# Patient Record
Sex: Female | Born: 1975 | Race: Black or African American | Hispanic: No | Marital: Single | Smoking: Current every day smoker
Health system: Southern US, Community
[De-identification: ages and names within clinical notes are randomized; demographics above are authoritative.]

## PROBLEM LIST (undated history)

## (undated) ENCOUNTER — Inpatient Hospital Stay (HOSPITAL_COMMUNITY): Payer: Self-pay

## (undated) DIAGNOSIS — F419 Anxiety disorder, unspecified: Secondary | ICD-10-CM

## (undated) DIAGNOSIS — F319 Bipolar disorder, unspecified: Secondary | ICD-10-CM

## (undated) DIAGNOSIS — F329 Major depressive disorder, single episode, unspecified: Secondary | ICD-10-CM

## (undated) DIAGNOSIS — B009 Herpesviral infection, unspecified: Secondary | ICD-10-CM

## (undated) HISTORY — DX: Bipolar disorder, unspecified: F31.9

## (undated) HISTORY — DX: Herpesviral infection, unspecified: B00.9

## (undated) HISTORY — DX: Major depressive disorder, single episode, unspecified: F32.9

## (undated) HISTORY — DX: Anxiety disorder, unspecified: F41.9

## (undated) HISTORY — PX: TONSILLECTOMY: SUR1361

---

## 2003-10-06 ENCOUNTER — Emergency Department (HOSPITAL_COMMUNITY): Admission: AD | Admit: 2003-10-06 | Discharge: 2003-10-06 | Payer: Self-pay | Admitting: Family Medicine

## 2004-01-27 ENCOUNTER — Inpatient Hospital Stay (HOSPITAL_COMMUNITY): Admission: AD | Admit: 2004-01-27 | Discharge: 2004-01-27 | Payer: Self-pay | Admitting: Obstetrics

## 2004-08-13 ENCOUNTER — Inpatient Hospital Stay (HOSPITAL_COMMUNITY): Admission: AD | Admit: 2004-08-13 | Discharge: 2004-08-13 | Payer: Self-pay | Admitting: Obstetrics & Gynecology

## 2004-08-30 ENCOUNTER — Inpatient Hospital Stay (HOSPITAL_COMMUNITY): Admission: AD | Admit: 2004-08-30 | Discharge: 2004-08-30 | Payer: Self-pay | Admitting: Obstetrics & Gynecology

## 2004-08-31 ENCOUNTER — Inpatient Hospital Stay (HOSPITAL_COMMUNITY): Admission: AD | Admit: 2004-08-31 | Discharge: 2004-09-04 | Payer: Self-pay | Admitting: Obstetrics

## 2004-08-31 ENCOUNTER — Encounter (INDEPENDENT_AMBULATORY_CARE_PROVIDER_SITE_OTHER): Payer: Self-pay | Admitting: *Deleted

## 2004-09-05 ENCOUNTER — Encounter: Admission: RE | Admit: 2004-09-05 | Discharge: 2004-10-05 | Payer: Self-pay | Admitting: Obstetrics & Gynecology

## 2005-08-19 IMAGING — CR DG ABDOMEN ACUTE W/ 1V CHEST
4 series · 4 of 4 positions shown · non-contrast
Comparison: None.

CLINICAL DATA: Status post C-section 4 days ago. Lower abdominal pain. Rule out ileus.

ABDOMEN SERIES - 2 VIEW & CHEST - 1 VIEW

[view not recorded (1 of 4)]
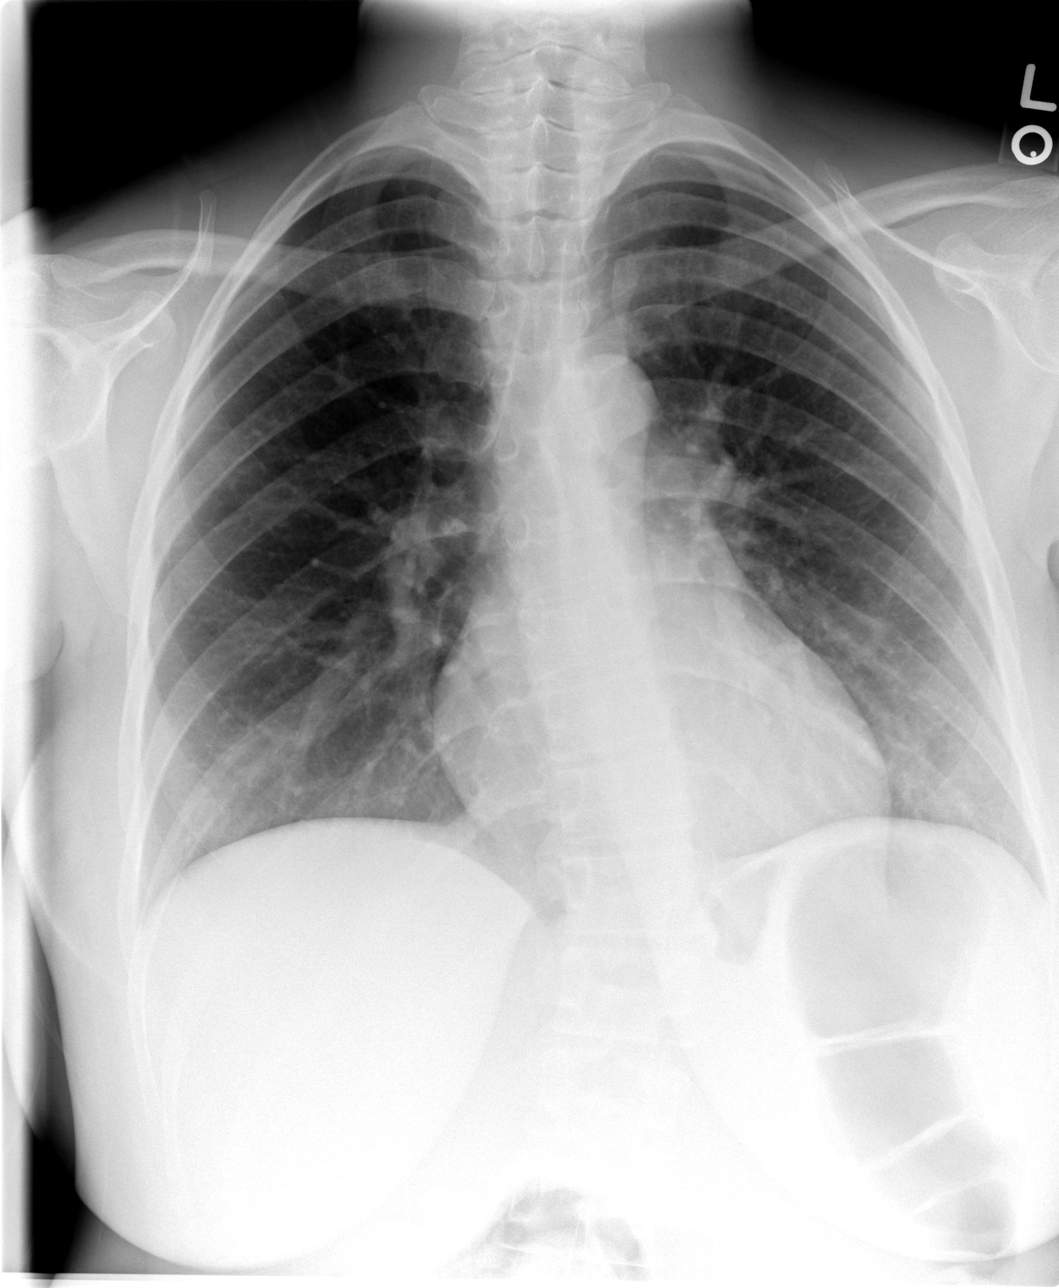

[view not recorded (2 of 4)]
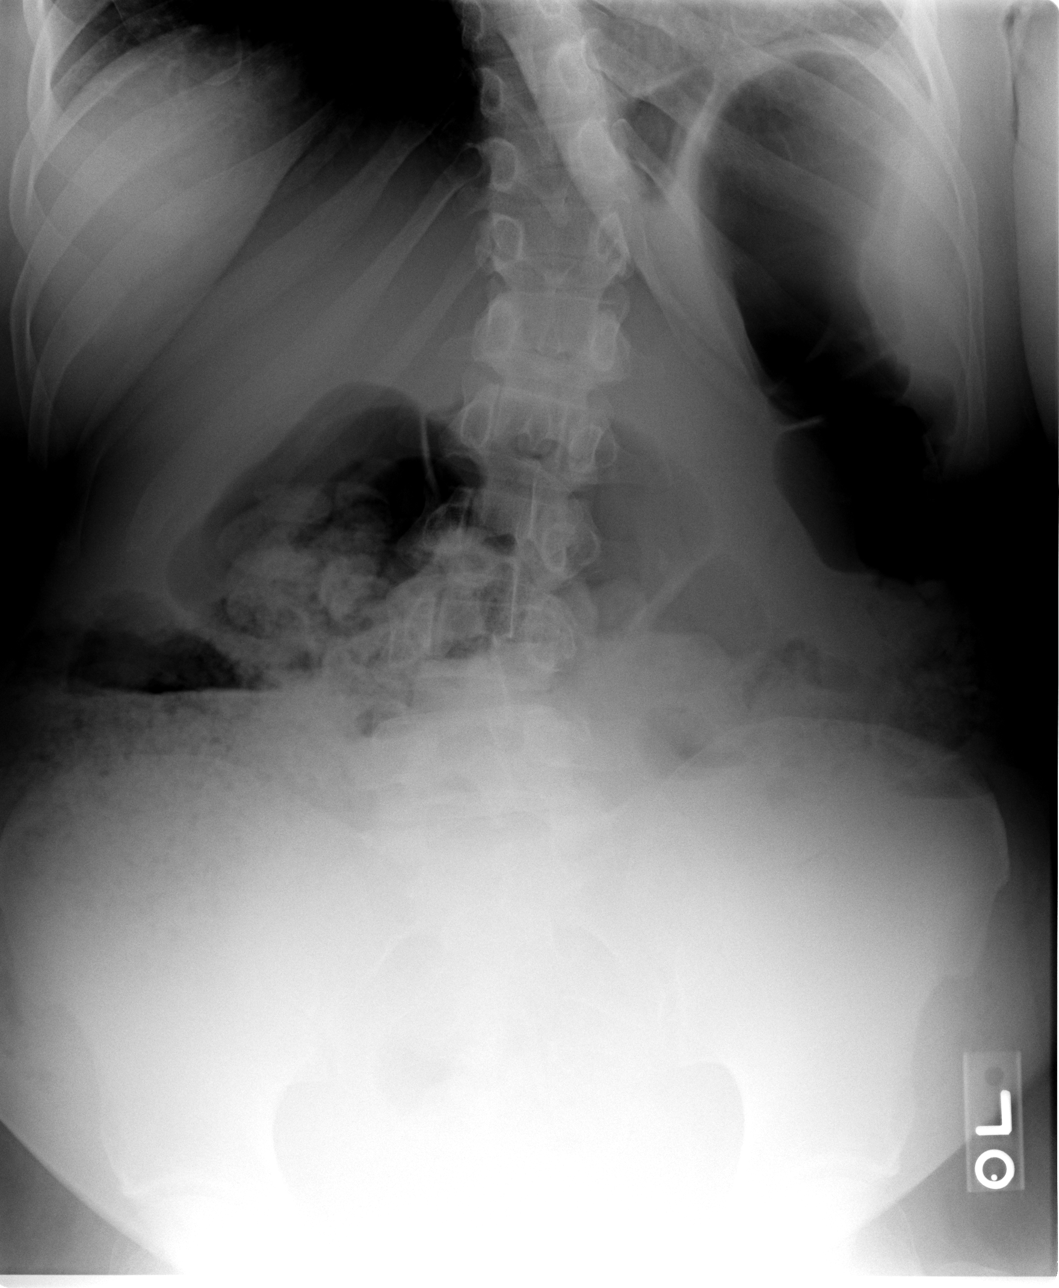

[view not recorded (3 of 4)]
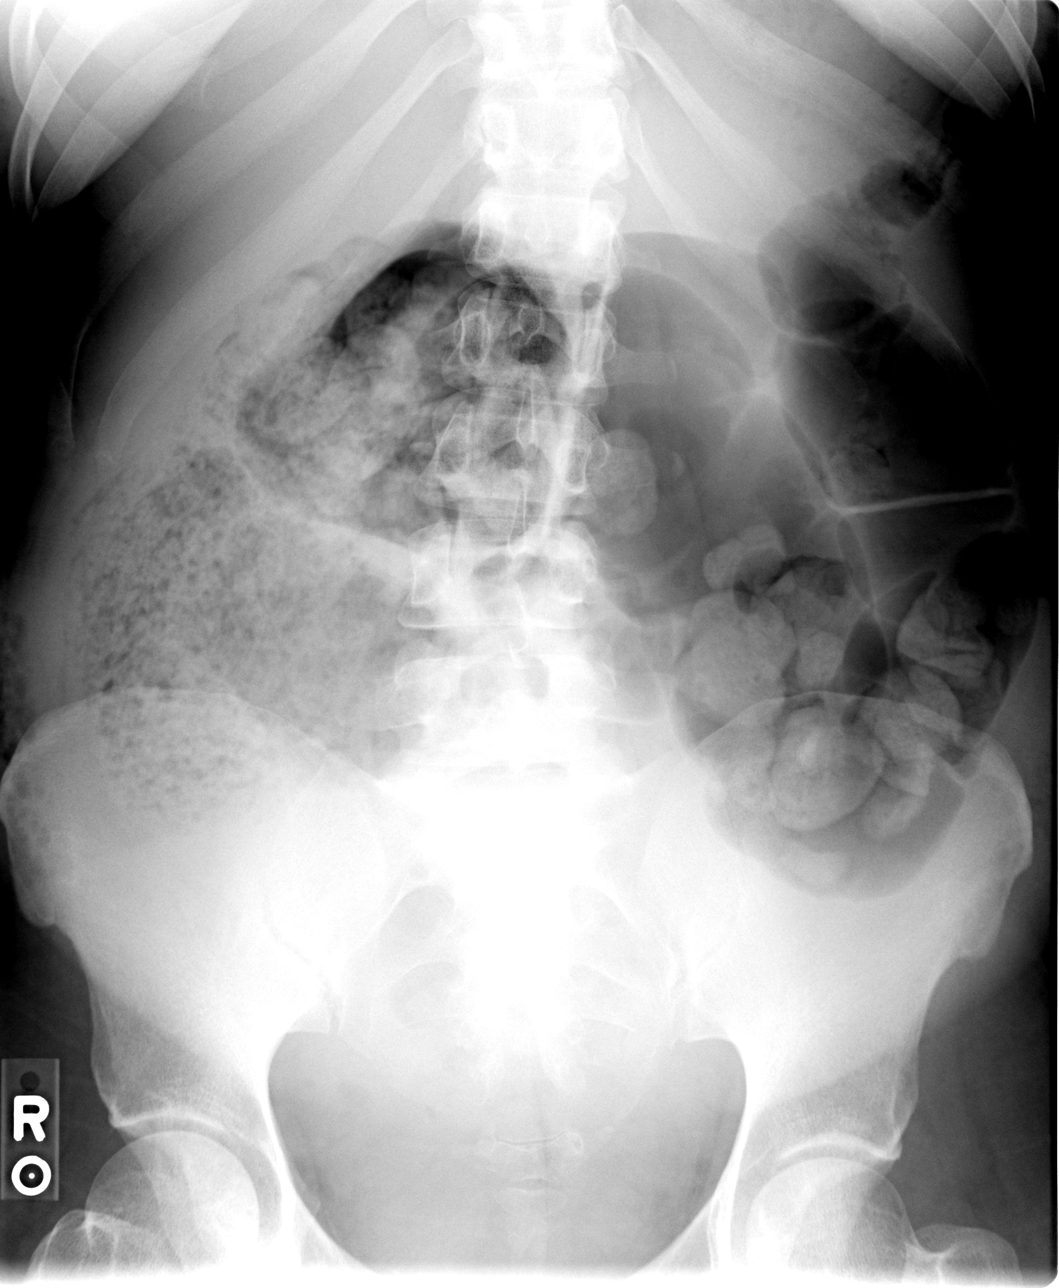

[view not recorded (4 of 4)]
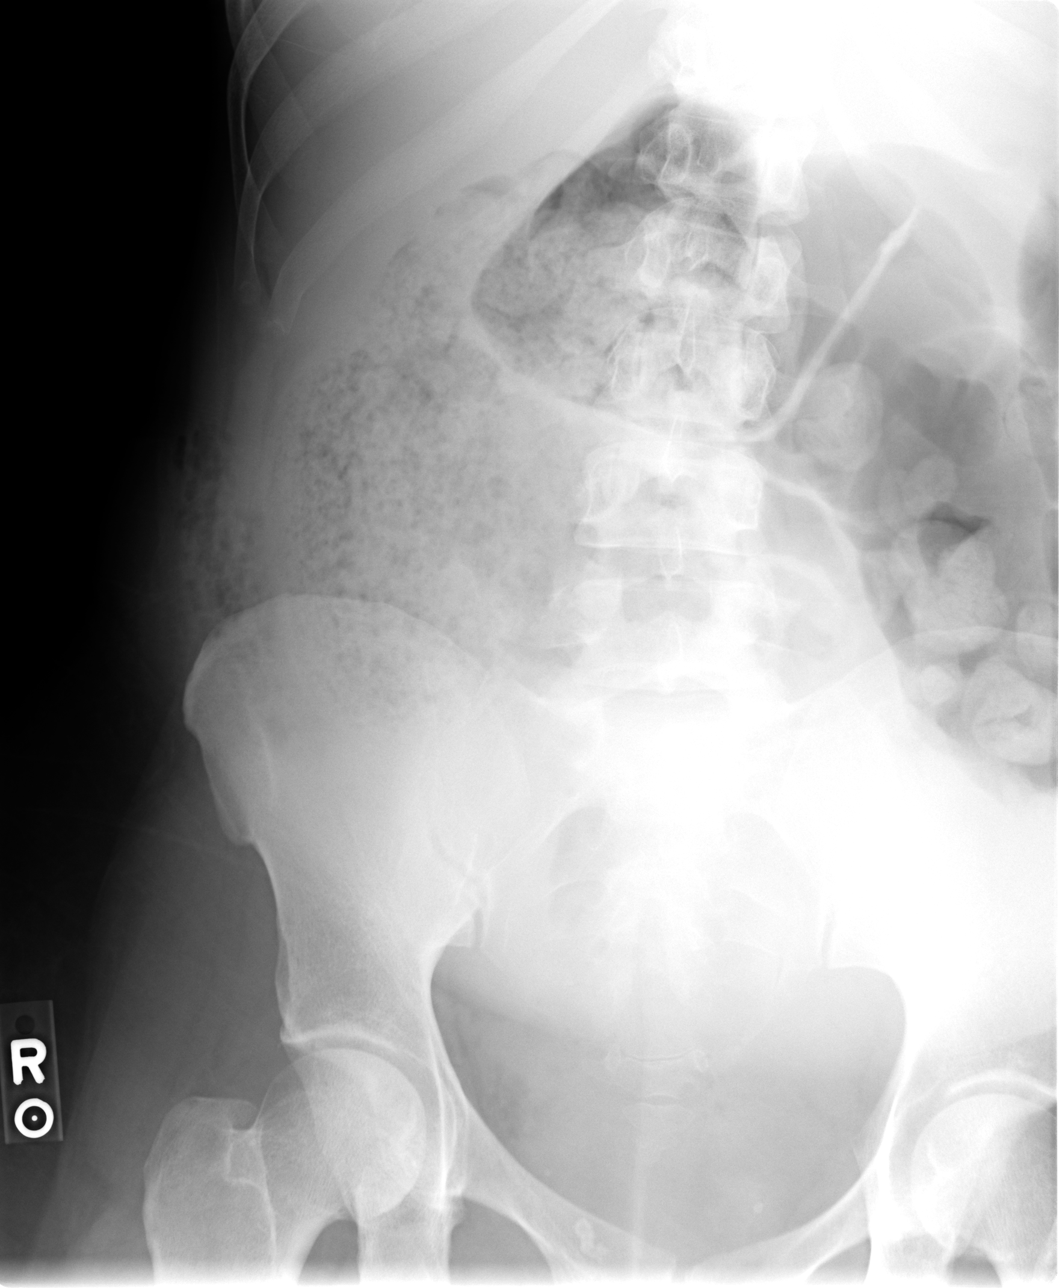

[4 of 4 positions shown; findings below may reference images not displayed]

FINDINGS: Upright chest shows clear lungs. The heart size is within normal limits. Image bony
structures are intact.

Supine and upright views of the abdomen are without evidence of intraperitoneal free air. Prominent
amount of stool is noted in the right colon with gaseous distention and formed stool noted in the
transverse colon. There is a paucity of small bowel gas in the pelvis consistent with recent
cesarean mottled gas is seen over the right lateral abdominal wall with an appearance suggesting
subcutaneous air.

IMPRESSION

No acute cardiopulmonary process.

Distended stool-filled ascending and transverse colon. Features are compatible with colonic
dysmotility.

Mottled gas seen over the right lateral abdominal wall. Subcutaneous emphysema is favored.
Pneumatosis is considered less likely. This may be postsurgical given the patient's history of
surgery 3 days ago although cellulitis could have this appearance. Please correlate clinically.

## 2009-05-12 ENCOUNTER — Emergency Department (HOSPITAL_COMMUNITY): Admission: EM | Admit: 2009-05-12 | Discharge: 2009-05-12 | Payer: Self-pay | Admitting: Emergency Medicine

## 2010-09-10 ENCOUNTER — Ambulatory Visit (HOSPITAL_COMMUNITY): Admission: RE | Admit: 2010-09-10 | Discharge: 2010-09-10 | Payer: Self-pay | Admitting: Obstetrics and Gynecology

## 2010-11-10 ENCOUNTER — Ambulatory Visit: Payer: Self-pay | Admitting: Obstetrics and Gynecology

## 2010-11-10 LAB — CONVERTED CEMR LAB
Antibody Screen: NEGATIVE
Basophils Relative: 0 % (ref 0–1)
Hgb F Quant: 0 % (ref 0.0–2.0)
Hgb S Quant: 0 % (ref 0.0–0.0)
MCHC: 31.6 g/dL (ref 30.0–36.0)
Monocytes Relative: 6 % (ref 3–12)
Neutro Abs: 8.1 10*3/uL — ABNORMAL HIGH (ref 1.7–7.7)
Neutrophils Relative %: 79 % — ABNORMAL HIGH (ref 43–77)
RBC: 3.82 M/uL — ABNORMAL LOW (ref 3.87–5.11)
Rubella: 104.9 intl units/mL — ABNORMAL HIGH
WBC: 10.2 10*3/uL (ref 4.0–10.5)

## 2010-11-11 ENCOUNTER — Encounter: Payer: Self-pay | Admitting: Obstetrics and Gynecology

## 2010-11-12 ENCOUNTER — Ambulatory Visit (HOSPITAL_COMMUNITY)
Admission: RE | Admit: 2010-11-12 | Discharge: 2010-11-12 | Payer: Self-pay | Source: Home / Self Care | Admitting: Family Medicine

## 2010-11-24 ENCOUNTER — Ambulatory Visit: Payer: Self-pay | Admitting: Obstetrics and Gynecology

## 2010-11-26 ENCOUNTER — Inpatient Hospital Stay (HOSPITAL_COMMUNITY)
Admission: AD | Admit: 2010-11-26 | Discharge: 2010-11-27 | Payer: Self-pay | Source: Home / Self Care | Attending: Obstetrics and Gynecology | Admitting: Obstetrics and Gynecology

## 2010-12-01 ENCOUNTER — Encounter: Payer: Self-pay | Admitting: Obstetrics & Gynecology

## 2010-12-01 ENCOUNTER — Inpatient Hospital Stay (HOSPITAL_COMMUNITY)
Admission: RE | Admit: 2010-12-01 | Discharge: 2010-12-04 | Payer: Self-pay | Source: Home / Self Care | Attending: Obstetrics & Gynecology | Admitting: Obstetrics & Gynecology

## 2011-01-27 ENCOUNTER — Ambulatory Visit: Payer: Self-pay | Admitting: Obstetrics & Gynecology

## 2011-02-21 ENCOUNTER — Ambulatory Visit: Payer: BC Managed Care – PPO | Admitting: Obstetrics and Gynecology

## 2011-02-21 DIAGNOSIS — Z3049 Encounter for surveillance of other contraceptives: Secondary | ICD-10-CM

## 2011-02-21 LAB — CBC
HCT: 29.1 % — ABNORMAL LOW (ref 36.0–46.0)
Hemoglobin: 7.8 g/dL — ABNORMAL LOW (ref 12.0–15.0)
MCH: 24 pg — ABNORMAL LOW (ref 26.0–34.0)
Platelets: 192 10*3/uL (ref 150–400)
Platelets: 214 10*3/uL (ref 150–400)
RBC: 3.31 MIL/uL — ABNORMAL LOW (ref 3.87–5.11)
RBC: 3.66 MIL/uL — ABNORMAL LOW (ref 3.87–5.11)
RDW: 18.4 % — ABNORMAL HIGH (ref 11.5–15.5)
WBC: 11.3 10*3/uL — ABNORMAL HIGH (ref 4.0–10.5)

## 2011-02-21 LAB — PROTEIN / CREATININE RATIO, URINE
Creatinine, Urine: 304.6 mg/dL
Protein Creatinine Ratio: 0.17 — ABNORMAL HIGH (ref 0.00–0.15)
Total Protein, Urine: 52 mg/dL

## 2011-02-21 LAB — COMPREHENSIVE METABOLIC PANEL
ALT: 14 U/L (ref 0–35)
AST: 20 U/L (ref 0–37)
Albumin: 2.5 g/dL — ABNORMAL LOW (ref 3.5–5.2)
Calcium: 8.8 mg/dL (ref 8.4–10.5)
Chloride: 106 mEq/L (ref 96–112)
Creatinine, Ser: 0.54 mg/dL (ref 0.4–1.2)
GFR calc Af Amer: >60 mL/min (ref >60)
Sodium: 136 mEq/L (ref 135–145)
Total Bilirubin: 0.3 mg/dL (ref 0.3–1.2)

## 2011-02-21 LAB — URINALYSIS, ROUTINE W REFLEX MICROSCOPIC
Ketones, ur: 15 mg/dL — AB
Leukocytes, UA: NEGATIVE
Nitrite: NEGATIVE
Protein, ur: 30 mg/dL — AB
Specific Gravity, Urine: >1.03 — ABNORMAL HIGH (ref 1.005–1.030)
Urobilinogen, UA: 1 mg/dL (ref 0.0–1.0)
pH: 6.5 (ref 5.0–8.0)

## 2011-02-21 LAB — SURGICAL PCR SCREEN: MRSA, PCR: NEGATIVE

## 2011-02-21 LAB — POCT URINALYSIS DIPSTICK
Bilirubin Urine: NEGATIVE
Glucose, UA: NEGATIVE mg/dL
Hgb urine dipstick: NEGATIVE
Specific Gravity, Urine: 1.02 (ref 1.005–1.030)

## 2011-02-21 LAB — TYPE AND SCREEN: Antibody Screen: NEGATIVE

## 2011-02-21 LAB — URINE MICROSCOPIC-ADD ON

## 2011-02-21 LAB — RPR: RPR Ser Ql: NONREACTIVE

## 2011-02-21 LAB — WET PREP, GENITAL
Trich, Wet Prep: NONE SEEN
Yeast Wet Prep HPF POC: NONE SEEN

## 2011-02-22 LAB — POCT URINALYSIS DIPSTICK
Bilirubin Urine: NEGATIVE
Hgb urine dipstick: NEGATIVE
Ketones, ur: NEGATIVE mg/dL
Specific Gravity, Urine: 1.025 (ref 1.005–1.030)
pH: 6.5 (ref 5.0–8.0)

## 2011-03-04 NOTE — Progress Notes (Signed)
Latoya Dean, SORTINO NO.:  [id number]  MEDICAL RECORD NO.:  [id number]           PATIENT TYPE:  A  LOCATION:  [facility]                   FACILITY:  [facility]  PHYSICIAN:  Catalina Antigua, MD     DATE OF BIRTH:  [birth year]  DATE OF SERVICE:                                 CLINIC NOTE  This is a 35 year old G3, P2-0-1-2 who is status post repeat C-section on December 04, 2010, who presents today for postpartum check.  The patient is currently without any complaints.  Denies pelvic pain or abnormal discharge.  The patient received Depo-Provera prior to leaving the hospital and states that her lochia has been reduced to daily spotting in the past few couple of weeks.  The patient is otherwise without any complaints.  She states that she is receiving ample help with the baby.  Denies any signs or symptoms of postpartum depression. Is bottle feeding the baby and has not resumed any sexual activity.  PHYSICAL EXAMINATION:  VITAL SIGNS:  Her blood pressure is 103/47, pulse of 64, weight of 77.2 kg, height of 67 inches, and temperature 98.8. LUNGS:  Her lungs are clear to auscultation bilaterally. HEART:  Regular rate and rhythm. BREASTS:  Her breasts are equal in size, nontender, no engorgement, and no palpable masses or lymphadenopathy. ABDOMEN:  Her abdomen is soft, nontender, and nondistended.  Incision is completely healed. PELVIC:  Normal vaginal mucosa and normal-appearing cervix.  No abnormal bleeding or discharge.  ASSESSMENT/PLAN:  This is a 35 year old G3, P2-0-1-2 who is status post repeat C-section on December 01, 2010, who presents today for postoperative check.  The patient is free to resume all regular activities without any limitations.  The patient is to continue Depo- Provera as she is happy with this current birth control option and will return every 12 weeks.  The patient is due for annual exam in November 2012.  The patient is to return otherwise on  a p.r.n. basis.          ______________________________ Catalina Antigua, MD    PC/MEDQ  D:  02/21/2011  T:  02/22/2011  Job:  [id number]

## 2011-04-29 NOTE — Op Note (Signed)
Latoya Dean, Latoya Dean             ACCOUNT NO.:  [id number]   MEDICAL RECORD NO.:  [id number]          PATIENT TYPE:  INP   LOCATION:  9146                          FACILITY:  [facility]   PHYSICIAN:  Roseanna Rainbow, M.D.DATE OF BIRTH:  1976/03/24   DATE OF PROCEDURE:  08/31/2004  DATE OF DISCHARGE:                                 OPERATIVE REPORT   PREOPERATIVE DIAGNOSES:  Intrauterine pregnancy at term, protracted latent  phase of labor, suspicious fetal heart rate tracing with variable  decelerations.   POSTOPERATIVE DIAGNOSES:  Intrauterine pregnancy at term, protracted latent  phase of labor, suspicious fetal heart rate tracing with variable  decelerations.  Occiput posterior presentation.   PROCEDURE:  Primary low transverse cesarean section via Pfannenstiel.   SURGEON:  Roseanna Rainbow, M.D.   ANESTHESIA:  Epidural.   COMPLICATIONS:  Left inferolateral extension of the uterine incision.   ESTIMATED BLOOD LOSS:  800 mL.   FLUIDS AND URINE OUTPUT:  As per anesthesiology.   FINDINGS:  Female infant cephalic presentation, left occiput posterior.  Neonatology present at delivery.  Normal uterus, tubes and ovaries.   DESCRIPTION OF PROCEDURE:  The patient was taken to the operating room where  her epidural anesthetic was found to be adequate.  She was then prepped and  draped in the usual sterile fashion in the dorsal supine position with a  leftward tilt.  A Pfannenstiel skin incision was then made with the scalpel  and carried through to the underlying of fascia with the Bovie. The fascia  was then nicked in the midline and the incision extended laterally with Mayo  scissors. The superior aspect of the fascial incision was then grasped with  Kocher clamps, elevated and the underlying rectus muscles dissected off.  Attention was then turned to the inferior aspect of this incision which in a  similar fashion was grasped and tinted up with Kocher clamps and the rectus  muscles dissected off. The rectus muscles were separated in the midline. The  paritoperitoneum was identified, tinted up and entered sharply.  The  peritoneal incision was then extended superiorly and inferiorly with good  visualization of the bladder.  The bladder blade was then inserted and the  vesicouterine peritoneum identified, grasped with __________ and entered  sharply with the Metzenbaum scissors. This incision was then extended  laterally and the bladder flap created digitally. The bladder blade was then  reinserted and the lower uterine segment incised in a transverse fashion  with the scalpel. The uterine incision was then extended with bandage  scissors. The bladder blade was then removed and the infant's head delivered  atraumatically. The nose and mouth were suctioned with the bulb suction and  the cord was clamped and cut.  The infant was handed off to the awaiting  neonatologist.  An umbilical artery cord gas was sent.  The placenta was  then removed manually. The uterus cleared of all clots and debris. The 2-3  cm lateral extension described above was repaired with #0 Monocryl in a  running locked fashion. A second layer of the same suture was used to obtain  excellent hemostasis.  The remainder of the uterine incision was repaired in  a similar fashion. The gutters were cleared of all clots. The peritoneum was  closed with 2-0 Vicryl. The fascia was reapproximated with #0 Vicryl in a  running fashion. The skin was closed with staples.  The patient tolerated  the procedure well. Sponge, lap and needle counts were correct x2.  One gram  of cefazolin was given and cord clamped. The patient was taken to the PACU  in stable condition.      LAJ/MEDQ  D:  09/01/2004  T:  09/01/2004  Job:  [id number]

## 2011-04-29 NOTE — Discharge Summary (Signed)
Latoya Dean, Latoya Dean             ACCOUNT NO.:  [id number]   MEDICAL RECORD NO.:  [id number]          PATIENT TYPE:  INP   LOCATION:  9146                          [facility]ILITY:  [facility]   PHYSICIAN:  Charles A. Clearance Coots, M.D.DATE OF BIRTH:  January 03, 1976   DATE OF ADMISSION:  08/31/2004  DATE OF DISCHARGE:  09/04/2004                                 DISCHARGE SUMMARY   ADMITTING DIAGNOSES:  1.  Forty weeks gestation.  2.  Uterine contractions.  3.  Early labor.   DISCHARGE DIAGNOSES:  1.  Forty weeks gestation.  2.  Uterine contractions.  3.  Early labor.  4.  Status post primary low transverse cesarean section for arrest of active      phase of labor, persistent occiput posterior position.  Delivered a      viable female on August 31, 2004 at 1344 with Apgars of 8 at one minute      and 9 at five minutes; weight of 6 pounds 10 ounces; cord pH of 7.29.      Mother and infant discharged home in good condition.   REASON FOR ADMISSION:  A 35 year old para 0 with estimated date of  confinement of August 30, 2004 presents with complaint of uterine  contractions.  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   PAST MEDICAL HISTORY:  Surgery:  Tonsillectomy.  Illnesses:  Urinary tract  infections.   MEDICATIONS:  Prenatal vitamins, Valtrex.   ALLERGIES:  No known drug allergies.   SOCIAL HISTORY:  Single.  Negative tobacco, alcohol, or drug use.   FAMILY HISTORY:  Noncontributory.   PHYSICAL EXAMINATION:  GENERAL:  Well-nourished, well-developed female in no  acute distress.  VITAL SIGNS:  Stable and she was afebrile.  Fetal heart tones were  reassuring.  The toco on external fetal monitoring revealed uterine  contractions every 2-4 minutes.  LUNGS:  Clear to auscultation bilaterally.  HEART:  Regular rate and rhythm.  ABDOMEN:  Gravid, nontender.  PELVIC:  Cervix was 3-4 cm  dilated, 80% effaced, vertex was at a -1 station.   LABORATORY VALUES:  Hemoglobin 11; hematocrit 35; white blood cell count  14,300; platelets 282,000.  Comprehensive metabolic panel within normal  limits.   HOSPITAL COURSE:  The patient was admitted and needed augmentation with  Pitocin because of slow progress.  Cervical exam essentially remained  without significant change and she dilated to 4-5 cm dilatation, 90%  effacement, and the vertex was at a 0 station.  There was no further  progress of labor after that point and after greater than 3 hours of no  progress, a decision was made to proceed with cesarean section delivery for  arrest of active phase of labor.  Primary low transverse cesarean section  was performed without complications on August 31, 2004.  A viable female  was delivered at 1344.  There were no intraoperative complications.  The  postoperative course was uncomplicated.  The patient was discharged home on  postoperative day #4 in good condition.  DISCHARGE LABORATORY VALUES:  Hemoglobin of 8.6; hematocrit 25.6; white  blood cell count 11,300; platelets 303,000.   DISCHARGE DISPOSITION:  1.  Medications:  Tylox and ibuprofen prescribed for pain.  Continue      prenatal vitamins.  Micronor was prescribed for birth control.  2.  Routine written instructions were given for discharge after cesarean      section.  3.  The patient is to follow up in the office in 1 week.     Char   CAH/MEDQ  D:  09/29/2004  T:  09/29/2004  Job:  [id number]

## 2011-05-10 ENCOUNTER — Ambulatory Visit: Payer: BC Managed Care – PPO

## 2011-06-15 NOTE — Discharge Summary (Addendum)
  Latoya Dean, Latoya Dean NO.:  [id number]  MEDICAL RECORD NO.:  [id number]  LOCATION:  9124                          [facility]ACILITY[facility]  PHYSICIAN:  Allie Bossier, MD        DATE OF BIRTH:  [birth year]  DATE OF ADMISSION:  12/01/2010 DATE OF DISCHARGE:  12/04/2010                              DISCHARGE SUMMARY   ADMISSION DIAGNOSIS:  Intrauterine pregnancy at 45 and 2 weeks and history of previous cesarean section and desired for repeat elective cesarean section.  DISCHARGE DIAGNOSIS:  Status post repeat low transverse cesarean section via Pfannenstiel skin incision.  ATTENDING:  Myra C. Marice Potter, MD  FELLOWMaryelizabeth Kaufmann, MD  OPERATION:  Repeat low transverse cesarean section via Pfannenstiel skin incision on December 01, 2010, by Dr. Macon Large.  FINDINGS:  Viable female infant in direct OP position, Apgars were 9 and 9, weight 7 pounds 4 ounces, clear amniotic fluid, and placenta sent to Pathology, otherwise there was normal uterus, tubes, and ovaries.  PERTINENT LABS:  The patient was noted to be anemic on admission with a hemoglobin of 8.8 and 27.4.  Her postoperative hemoglobin was 7.8 and 24.2.  Otherwise asymptomatic and doing well and ambulating fine.  HISTORY OF PRESENT ILLNESS:  This is a 35 year old gravida 3, para 2-0-1- 2 with a history of previous C-section who was admitted at 83 and 2 weeks for an elective repeat C-section.  The patient was not in labor at the time of admission.  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  Tobacco abuse and GBS was positive.  The patient went for her elective repeat C- section on December 01, 2010.  The patient tolerated the procedure well. The patient's postoperative course was benign.  She was discharged home on postoperative day #3 in stable condition and staples were removed prior to discharge.  DISPOSITION:  Discharged to home.  DISCHARGE  CONDITION:  Stable.  DISCHARGE MEDICATIONS: 1. Ibuprofen 600 mg 1 tablet p.o. q.6 h. as needed. 2. Percocet 5/325 one tablet p.o. q.4 h. p.r.n. 3. Ferrous sulfate 325 one tablet p.o. b.i.d. 4. Colace 100 mg 1 tablet p.o. b.i.d. 5. The patient was given Depo-Provera prior to discharge for     contraception.  FOLLOWUP:  The patient is to follow up [facility] 6 weeks and will have the baby-love nurse for an incision at 2 weeks around December 16, 2010.  ER WARNINGS:  The patient return to the emergency department if any fever, chills, nausea, vomiting, any worsening abdominal pain, any problems with her incision such as redness, swelling, discharge, or opening.    ______________________________ Maryelizabeth Kaufmann, MD   ______________________________ Allie Bossier, MD    LC/MEDQ  D:  12/04/2010  T:  12/04/2010  Job:  [id number]  Electronically Signed by Nicholaus Bloom MD on 06/15/2011 08:03:48 AM Electronically Signed by Maryelizabeth Kaufmann MD on 06/21/2011 08:45:36 AM

## 2011-07-05 ENCOUNTER — Telehealth: Payer: Self-pay | Admitting: *Deleted

## 2011-07-05 NOTE — Telephone Encounter (Signed)
Pt left message stating that she delivered her baby on 12/01/10 and has PP depression which is having a serious impact on her work. She is requesting a note to be out of work for disability ASAP.  I called pt. back, she stated that she has been seeing a counselor through her employer's EAP program and that counselor told her that she was not able to authorize disability. I responded that our providers also cannot authorize disability @ this time since she has not been under their care for the problem and had her baby 7 mos. ago. I recommended that pt speak w/the counselor again if she feels strongly that she needs to be on disability as she will most likely require care from another provider in order to accomplish that. Pt voiced understanding. Pt was given the tel# [phone] for [place] [facility] crisis intervention.

## 2011-07-07 ENCOUNTER — Ambulatory Visit: Payer: BC Managed Care – PPO | Admitting: Family Medicine

## 2011-07-14 ENCOUNTER — Ambulatory Visit (HOSPITAL_COMMUNITY)
Admission: RE | Admit: 2011-07-14 | Discharge: 2011-07-14 | Disposition: A | Discharge status: Home / Self Care | Payer: BC Managed Care – PPO | Attending: Psychiatry | Admitting: Psychiatry

## 2011-07-14 DIAGNOSIS — F411 Generalized anxiety disorder: Secondary | ICD-10-CM | POA: Insufficient documentation

## 2011-07-20 ENCOUNTER — Other Ambulatory Visit (HOSPITAL_COMMUNITY): Payer: BC Managed Care – PPO | Admitting: Psychiatry

## 2011-07-21 ENCOUNTER — Other Ambulatory Visit (HOSPITAL_COMMUNITY): Payer: BC Managed Care – PPO | Attending: Psychiatry | Admitting: Psychiatry

## 2011-07-21 DIAGNOSIS — F411 Generalized anxiety disorder: Secondary | ICD-10-CM | POA: Insufficient documentation

## 2011-07-21 DIAGNOSIS — B009 Herpesviral infection, unspecified: Secondary | ICD-10-CM | POA: Insufficient documentation

## 2011-07-22 ENCOUNTER — Other Ambulatory Visit (HOSPITAL_BASED_OUTPATIENT_CLINIC_OR_DEPARTMENT_OTHER): Payer: BC Managed Care – PPO | Admitting: Psychiatry

## 2011-07-22 DIAGNOSIS — F3189 Other bipolar disorder: Secondary | ICD-10-CM

## 2011-07-25 ENCOUNTER — Other Ambulatory Visit (HOSPITAL_COMMUNITY): Payer: BC Managed Care – PPO | Admitting: Psychiatry

## 2011-07-26 ENCOUNTER — Other Ambulatory Visit (HOSPITAL_COMMUNITY): Payer: BC Managed Care – PPO | Admitting: Psychiatry

## 2011-07-27 ENCOUNTER — Other Ambulatory Visit (HOSPITAL_COMMUNITY): Payer: BC Managed Care – PPO | Admitting: Psychiatry

## 2011-07-28 ENCOUNTER — Encounter (HOSPITAL_COMMUNITY): Payer: BC Managed Care – PPO | Admitting: Psychiatry

## 2011-07-28 ENCOUNTER — Other Ambulatory Visit (HOSPITAL_COMMUNITY): Payer: BC Managed Care – PPO | Admitting: Psychiatry

## 2011-07-29 ENCOUNTER — Other Ambulatory Visit (HOSPITAL_COMMUNITY): Payer: BC Managed Care – PPO | Admitting: Psychiatry

## 2011-08-01 ENCOUNTER — Other Ambulatory Visit (HOSPITAL_COMMUNITY): Payer: BC Managed Care – PPO | Admitting: Psychiatry

## 2011-08-02 ENCOUNTER — Other Ambulatory Visit (HOSPITAL_COMMUNITY): Payer: BC Managed Care – PPO | Admitting: Psychiatry

## 2011-08-03 ENCOUNTER — Other Ambulatory Visit (HOSPITAL_COMMUNITY): Payer: BC Managed Care – PPO | Admitting: Psychiatry

## 2011-08-04 ENCOUNTER — Ambulatory Visit (INDEPENDENT_AMBULATORY_CARE_PROVIDER_SITE_OTHER): Payer: BC Managed Care – PPO | Admitting: Physician Assistant

## 2011-08-04 ENCOUNTER — Other Ambulatory Visit (HOSPITAL_COMMUNITY): Payer: BC Managed Care – PPO | Admitting: Psychiatry

## 2011-08-04 ENCOUNTER — Encounter: Payer: Self-pay | Admitting: Physician Assistant

## 2011-08-04 VITALS — BP 123/66 | HR 69 | Temp 98.0°F | Ht 67.0 in | Wt 143.0 lb

## 2011-08-04 DIAGNOSIS — B373 Candidiasis of vulva and vagina: Secondary | ICD-10-CM

## 2011-08-04 DIAGNOSIS — F329 Major depressive disorder, single episode, unspecified: Secondary | ICD-10-CM

## 2011-08-04 DIAGNOSIS — F32A Depression, unspecified: Secondary | ICD-10-CM | POA: Insufficient documentation

## 2011-08-04 HISTORY — DX: Depression, unspecified: F32.A

## 2011-08-04 NOTE — Patient Instructions (Signed)
Candida Infection In Adults A candida infection (also called yeast, fungus and Monilia infection) is an overgrowth of yeast that can occur present anywhere on the body. A yesast infection commonly occurs in warm, moist body areas. Usually, the infection remains localized but can spread to become a systemic infection. A yeast infection may be a sign of a more severe disease such as diabetes, leukemia, or AIDS. A yeast infection can occur in both men and women. In women, Candida vaginitis is a vaginal infection. It is one of the most common causes of vaginitis. Men usually do not have symptoms or know they have an infection until other problems develop. Men may find out they have a yeast infection because their sex partner has a yeast infection. Uncircumcised men are more likely to get a yeast infection than circumcised men. This is because the uncircumcised glans is not exposed to air and does not remain as dry as that of a circumcised glans. Older adults may develop yeast infections around dentures. CAUSES Women  Antibiotics.  Steroid medication taken for a long time.   Being overweight (obese).   Diabetes.   Poor immune condition.   Certain serious medical conditions.   Immune suppressive medications for organ transplant patients.  Chemotherapy.   Pregnancy.   Menstration.   Stress and fatigue.   Intravenous drug use.   Oral contraceptives.  Wearing tight-fitting clothes in the crotch area.   Catching it from a sex partner who has a yeast infection.   Spermicide.   Intravenous, urinary, or other catheters.   Men  Catching it from a sex partner who has a yeast infection.  Having oral or anal sex with a person who has the infection.   Spermicide.   Diabetes.  Antibiotics.   Poor immune system.   Medications that suppress the immune system.   Intravenous drug use.  Intravenous, urinary, or other catheters.   SYMPTOMS Women  Thick, white vaginal  discharge.  Vaginal itching.   Redness and swelling in and around the vagina.   Irritation of the lips of the vagina and perineum.   Blisters on the vaginal lips and perineum.  Painful sexual intercourse.   Low blood sugar (hypoglycemia).   Painful urination.   Bladder infections.  Intestinal problems such as constipation, indigestion, bad breath, bloating, increase in gas, diarrhea, or loose stools.   Men  Men may develop intestinal problems such as constipation, indigestion, bad breath, bloating, increase in gas, diarrhea, or loose stools.  Dry, cracked skin on the penis with itching or discomfort.  Jock itch.   Dry, flaky skin.   Athlete's foot.  Hypoglycemia.   DIAGNOSIS Women  A history and an exam are performed.   The discharge may be examined under a microscope.   A culture may be taken of the discharge.  Men  A history and an exam are performed.   Any discharge from the penis or areas of cracked skin will be looked at under the microscope and cultured.   Stool samples may be cultured.  TREATMENT Women  Vaginal antifungal suppositories and creams.  Medicated creams to decrease irritation and itching on the outside of the vagina.   Warm compresses to the perineal area to decrease swelling and discomfort.   Oral antifungal medications.   Medicated vaginal suppositories or cream for repeated or recurrent infections.   Wash and dry the irritation areas before applying the cream.  Eating yogurt with lactobacillus may help with prevention and treatment.  Sometimes painting the vagina with gentian violet solution may help if creams and suppositories do not work.   Men  Antifungal creams and oral antifungal medications.  Sometimes treatment must continue for 30 days after the symptoms go away to prevent recurrence.  HOME CARE INSTRUCTIONS Women  Use cotton underwear and avoid tight-fitting clothing.  Avoid colored, scented toilet paper and  deodorant tampons or pads.   Do not douche.   Keep your diabetes under control.   Finish all the prescribed medications.   Keep your skin clean and dry.   Consume milk or yogurt with lactobacillus active culture regularly. If you get frequent yeast infections and think that is what the infection is, there are over-the-counter medications that you can get. If the infection does not show healing in 3 days, talk to your caregiver.  Tell your sex partner you have a yeast infection. Your partner may need treatment also, especially if your infection does not clear up or recurs.   Men  Keep your skin clean and dry.  Keep your diabetes under control.   Finish all prescribed medications.   Tell your sex partner that you have a yeast infection so they can be treated if necessary.  SEEK MEDICAL CARE IF:  Your symptoms do not clear up or worsen in one week after treatment.   You have an oral temperature above 100.5.   You have trouble swallowing or eating for a prolonged time.   You develop blisters on and around your vagina.   You develop vaginal bleeding and it is not your menstrual period.   You develop abdominal pain.   You develop intestinal problems as mentioned above.   You get weak or lightheaded.   You have painful or increased urination.   You have pain during sexual intercourse.  MAKE SURE YOU:  Understand these instructions.   Will watch your condition.   Will get help right away if you are not doing well or get worse.  Document Released: 01/05/2005 Document Re-Released: 05/18/2010 [facility] Patient Information 2011 [place], [place].IMPORTANT: HOW TO USE THIS INFORMATION:  This is a summary and does NOT have all possible information about this product. This information does not assure that this product is safe, effective, or appropriate for you. This information is not individual medical advice and does not substitute for the advice of your health care professional.  Always ask your health care professional for complete information about this product and your specific health needs.    ETONOGESTREL - IMPLANT (et-oh-no-GES-trel)    COMMON BRAND NAME(S): Implanon    USES:  This medication is used to prevent pregnancy. It is a thin plastic rod about the size of a matchstick that is inserted under the skin by a health care professional. The rod slowly releases etonogestrel into the body over a 3-year period. The rod must be removed after 3 years and can be replaced if continued birth control is desired. The rod can be removed at any time by a trained health care professional if birth control is no longer desired or there are side effects. It does not contain any estrogen. Etonogestrel (a form of progestin) is a hormone that prevents pregnancy by preventing the release of an egg (ovulation) and by changing the womb and cervical mucus to make it more difficult for an egg to meet sperm (fertilization) or for the fertilized egg to attach to the wall of the womb (implantation). This medication may not work as well in women who are  very overweight or those taking certain drugs. (See also Drug Interactions section.) Discuss your birth control options with your doctor. Using this medication does not protect you or your partner against sexually transmitted diseases (e.g., HIV, gonorrhea).    HOW TO USE:  Read the Patient Information Leaflet provided by your pharmacist or health care provider before the rod is placed. Read and sign the Informed Consent provided by your doctor. You will also be given a User Card with the date and the place on your body where the rod was inserted. Keep the card and use it to remind yourself when to schedule an appointment to have the rod removed. If you have any questions, ask your doctor or pharmacist. Ask your doctor about the best time to schedule your appointment to have the rod placed. Your doctor may want you to have a pregnancy test first. The  medication usually starts working right away when the rod is inserted on days 1 through 5 after the start of your regular menstrual bleeding. If your appointment is at another time in your menstrual cycle, you may need to use a non-hormonal form of birth control (e.g., condoms, diaphragm, spermicide) for the first 7 days after the rod is placed. Ask your doctor about whether you need back-up birth control. The rod will be inserted into the skin in your upper arm. Usually it will be placed in the arm opposite the side you write with. Be sure you can feel the rod underneath your skin after it has been placed. There will be 2 bandages covering the area where the rod is placed. Leave the top bandage on for 24 hours. Keep the smaller bandage on for 3-5 days or as directed by your doctor. Keep the bandage clean and dry.    SIDE EFFECTS:  Nausea, stomach cramping/bloating, dizziness, headache, breast tenderness, acne, hair loss, weight gain, and vaginal irritation/discharge may occur. Pain, bruising, numbness, infection, and scarring may occur at the site where the rod is placed. If any of these effects persist or worsen, notify your doctor promptly. Your periods may be early or late, shorter or longer, heavier or lighter than normal. You may also have some spotting between periods, especially during the first several months of use. If bleeding is prolonged (more than 8 days) or unusually heavy, contact your doctor. If you miss 2 periods in a row, contact your doctor for a pregnancy test. Remember that your doctor has prescribed this medication because he or she has judged that the benefit to you is greater than the risk of side effects. Many people using this medication do not have serious side effects. The rod must be removed after 3 years. This is usually a simple procedure done in your doctor's office. Rarely (e.g., if the rod has been placed too deeply or can't be felt), the rod may require surgery to remove. Tell  your doctor immediately if any of these unlikely but serious side effects occur: depression, unwanted facial/body hair. This medication may rarely cause serious (sometimes fatal) problems from blood clots (e.g., pulmonary embolism, stroke, heart attack). Seek immediate medical attention if you experience: sudden shortness of breath, chest/jaw/left arm pain, confusion, coughing up blood, sudden dizziness/fainting, pain/swelling/warmth in the groin/calf, tingling/weakness/numbness in the arms/legs, headaches that are different from those you may have experienced in the past (e.g., headaches with other symptoms such as vision changes/lack of coordination, existing migraines becoming worse, sudden/very severe headaches), slurred speech, weakness on one side of the body, vision problems/changes.  Tell your doctor immediately if any of these rare but very serious side effects occur: severe stomach/abdominal/pelvic pain, lumps in the breast, unusual tiredness, dark urine, yellowing eyes/skin. A very serious allergic reaction to this drug is rare. However, seek immediate medical attention if you notice any symptoms of a serious allergic reaction, including: rash, itching/swelling (especially of the face/tongue/throat), severe dizziness, trouble breathing. This is not a complete list of possible side effects. If you notice other effects not listed above, contact your doctor or pharmacist. In the [place] - Call your doctor for medical advice about side effects. You may report side effects to FDA at 1-800-FDA-1088. In [place] - Call your doctor for medical advice about side effects. You may report side effects to [facility] [place] at [phone].    PRECAUTIONS:  Before having the rod placed, tell your doctor or pharmacist if you are allergic to etonogestrel; or to other progestins; or to any anesthetics or antiseptics that might be used in the procedure; or if you have any other allergies. This product may contain inactive  ingredients, which can cause allergic reactions or other problems. Talk to your pharmacist for more details. This medication should not be used if you have certain medical conditions. Before using this medicine, consult your doctor or pharmacist if you have: abnormal breast exam, breast cancer, heart attack/stroke/other blood clots (e.g., in the legs, eyes, lungs), liver problems (e.g., liver tumor, active liver disease), current or suspected pregnancy, unexplained vaginal bleeding. Before using this medication, tell your doctor or pharmacist your medical history, especially of: depression, high blood pressure, low levels of "good" cholesterol (HDL), diabetes, gall bladder disease, heart disease (e.g., chest pain), history of yellowing eyes/skin (jaundice) during pregnancy or while using birth control pills, migraine headaches, seizures, long periods of sitting or lying down (e.g., immobility such as being bedridden). Smoking cigarettes/using tobacco while using hormonal birth control (implant/pill/patch/ring) increases your risk of heart problems and stroke. Do not smoke. The risk of heart problems increases with age (especially in women over 44) and with frequent smoking (15 or more cigarettes a day). Notify your doctor at least 4 weeks beforehand if you will be having surgery or will be confined to a chair/bed for a long time (e.g., a long plane flight). You may need to have this medication removed temporarily or take special precautions at these times while you are using this drug. The drug in this implant may cause blotchy, dark areas on your skin (melasma). Sunlight may intensify this effect. If this occurs, avoid prolonged sun exposure, use a sunscreen, and wear protective clothing when outdoors. If you are nearsighted or wear contact lenses, you may develop vision problems or may have problems wearing your contact lenses. Contact your eye doctor if these problems occur. This product should not be used during  pregnancy. If you become pregnant or think you may be pregnant, inform your doctor immediately. A certain serious pregnancy problem (ectopic pregnancy) may be more likely if you become pregnant while using this product. This medication passes into breast milk in small amounts. Consult your doctor before breast-feeding.    DRUG INTERACTIONS:  Your doctor or pharmacist may already be aware of any possible drug interactions and may be monitoring you for them. Do not start, stop, or change the dosage of any medicine before checking with your doctor or pharmacist first. This drug should not be used with the following medication because very serious interactions may occur: sodium tetradecyl sulfate. If you are currently using the medication  listed above, tell your doctor or pharmacist before starting this medication. Before using this medication, tell your doctor or pharmacist of all prescription and nonprescription/herbal products you may use, especially of: azole antifungals (e.g., fluconazole, itraconazole). Certain drugs can decrease the effectiveness of birth control by decreasing the amount of birth control hormones in your system. This can result in pregnancy. These drugs include: aprepitant, bexarotene, bosentan, dapsone, felbamate, griseofulvin, certain HIV drugs (such as amprenavir, indinavir, nelfinavir, nevirapine, ritonavir, zidovudine), modafinil, phenylbutazone, rifamycins (e.g., rifampin), many seizure medications (e.g., barbiturates, carbamazepine, phenytoin, topiramate), St. John's wort. Consult your doctor or pharmacist for details, and ask if you should use additional reliable birth control methods while taking any of the drugs listed above. This medication can affect the results of certain lab tests (e.g., sex-hormone-binding globulin, thyroid). Make sure laboratory personnel and all your doctors know you use this medication. This document does not contain all possible interactions. Therefore,  before using this product, tell your doctor or pharmacist of all the products you use. Keep a list of all your medications with you, and share the list with your doctor and pharmacist.    OVERDOSE:  This medication may be harmful if swallowed. Overdose may occur if more than 1 rod is placed or the old rod is not removed when a new rod is placed. If overdose is suspected, contact your local poison control center or emergency room immediately. [place] residents can call the [place] National Poison Hotline at 820-033-1412. [place] residents can call a Technical sales engineer center.    NOTES:  Keep all laboratory and medical appointments. You should have regular complete physical exams including blood pressure, breast exam, pelvic exam, and screening for cervical cancer (Pap smear). Follow your doctor's instructions for examining your own breasts, and report any lumps immediately. Consult your doctor for more details.    MISSED DOSE:  The rod must be removed or replaced after 3 years.    STORAGE:  Store at room temperature at 77 degrees F (25 degrees C) away from light and moisture until insertion. Brief storage between 59-86 degrees F (15-30 degrees C) is permitted. Do not store in the bathroom. Keep all medicines away from children and pets. Do not flush medications down the toilet or pour them into a drain unless instructed to do so. Properly discard this product when it is expired or no longer needed. Consult your pharmacist or local waste disposal company for more details about how to safely discard your product.    Information last revised September 2010 Copyright(c) 2010 [employer].

## 2011-08-04 NOTE — Progress Notes (Signed)
Chief Complaint:  restart depo,  postpartum depression and Vaginitis   Latoya Dean is  35 y.o. [id number].  Patient's last menstrual period was 07/01/2011.Marland Kitchen  Her pregnancy status is negative.  She presents complaining of restart depo,  postpartum depression and Vaginitis . Discharge onset is described as sudden and has been present for  3 weeks.  Is milky white and is not accompanied by itching.  Has had yeast infections before and feels this is a similar problem.  The depression began approximately 6 months ago, she was being seen by a behavioral health provider but is in the process of switching to a new provider, would like to extend her time off of work until she can become established at this provider.   Past Medical History: Past Medical History  Diagnosis Date  . Depression   . HSV-2 (herpes simplex virus 2) infection     Past Surgical History: Past Surgical History  Procedure Date  . Tonsillectomy   . Cesarean section     Family History: Family History  Problem Relation Age of Onset  . Heart disease Mother   . Heart disease Maternal Grandmother   . Hypertension Maternal Grandmother   . Cancer Maternal Grandfather     multiple myeloma  . Hypertension Paternal Grandmother   . Diabetes Paternal Grandmother     Social History: History  Substance Use Topics  . Smoking status: Current Everyday Smoker -- 1.0 packs/day for 19 years  . Smokeless tobacco: Never Used  . Alcohol Use: No    Allergies: No Known Allergies   (Not in a hospital admission)  Review of Systems - Negative except vaginal discharge as seen above and depression.  Physical Exam   Blood pressure 123/66, pulse 69, temperature 98 F (36.7 C), temperature source Oral, height 5\' 7"  (1.702 m), weight 143 lb (64.864 kg), last menstrual period 07/01/2011.  General: General appearance - alert, well appearing, and in no distress, oriented to person, place, and time and normal appearing weight Chest -  clear to auscultation, no wheezes, rales or rhonchi, symmetric air entry Heart - normal rate, regular rhythm, normal S1, S2, no murmurs, rubs, clicks or gallops Focused Gynecological Exam: normal external genitalia, vulva, vagina, cervix. WET MOUNT done - results: pending  Labs: Recent Results (from the past 24 hour(s))  POCT PREGNANCY, URINE   Collection Time   08/04/11  1:51 PM      Component Value Range   Preg Test, Ur NEGATIVE       Assessment: Depression Possible candidiasis Birth control  Plan: Follow up for pap smear and annual in November or PRN Keep appointment and establish care with Behavioral health provider Will call if abnormal results of wet prep Due to possible link between increased depression/depo other birth control options were discussed with the patient, she was interested and sent home with information on Implanon and will follow-up if this is the option she would like to pursue. Will extend LOA for additional 30 days to establish mental health care, additional extension will be managed by Behavioral health prn  [name] E. 08/04/2011,3:02 PM

## 2011-08-05 ENCOUNTER — Other Ambulatory Visit (HOSPITAL_COMMUNITY): Payer: BC Managed Care – PPO | Admitting: Psychiatry

## 2011-08-05 LAB — WET PREP, GENITAL
Clue Cells Wet Prep HPF POC: NONE SEEN
Trich, Wet Prep: NONE SEEN

## 2011-08-08 ENCOUNTER — Other Ambulatory Visit (HOSPITAL_COMMUNITY): Payer: BC Managed Care – PPO | Admitting: Psychiatry

## 2011-08-09 ENCOUNTER — Other Ambulatory Visit (HOSPITAL_COMMUNITY): Payer: BC Managed Care – PPO | Admitting: Psychiatry

## 2011-08-10 ENCOUNTER — Other Ambulatory Visit (HOSPITAL_COMMUNITY): Payer: BC Managed Care – PPO

## 2011-08-11 ENCOUNTER — Telehealth: Payer: Self-pay | Admitting: *Deleted

## 2011-08-11 ENCOUNTER — Other Ambulatory Visit (HOSPITAL_COMMUNITY): Payer: BC Managed Care – PPO

## 2011-08-11 NOTE — Telephone Encounter (Signed)
Latoya Dean called 08/11/11 at 8:56am and left a message stating she wanted her lab results. Called pt. And left a message we are returning her call- please call clinic if you still need assistance

## 2011-08-12 ENCOUNTER — Other Ambulatory Visit (HOSPITAL_COMMUNITY): Payer: BC Managed Care – PPO

## 2011-08-12 NOTE — Telephone Encounter (Signed)
Pt called again 08/11/11 at 2:14 pm and left another message calling for results

## 2011-08-16 ENCOUNTER — Other Ambulatory Visit (HOSPITAL_COMMUNITY): Payer: BC Managed Care – PPO

## 2011-08-16 NOTE — Telephone Encounter (Signed)
Called pt. And left a message we are returning your call for the second time and haven't reached you, if you still need assistance please call back to clinic

## 2011-08-26 ENCOUNTER — Telehealth: Payer: Self-pay | Admitting: *Deleted

## 2011-08-26 NOTE — Telephone Encounter (Signed)
Misty Stanley from [employer] calling for Dr. Natale Milch requesting 90 day refill of Valcyclovir HCL  500 tabs. Please call if you can renew by phone- [phone]  Reference to [id number]  Or fax to [phone]

## 2011-08-26 NOTE — Telephone Encounter (Signed)
Have I ever seen this patient?

## 2011-08-29 NOTE — Telephone Encounter (Signed)
You saw this patient during her pregnancy and prescribed this med.

## 2011-09-08 NOTE — Telephone Encounter (Signed)
Pharmacy ([facility]) contacted and Valtrex 500mg  daily x 90 days with 1 refill provided. Encounter now closed.

## 2011-09-19 ENCOUNTER — Telehealth: Payer: Self-pay | Admitting: *Deleted

## 2011-09-19 NOTE — Telephone Encounter (Signed)
Received a Call on Nurse line at [phone] this am from [facility] from [facility] Disability for Dr. Shawnie Pons. States you can return her call at [phone] and may leave a message if you don't get her.  Calling about claim #[id number], about received her short term disability papers for until 09/03/11 but did not have any observed symptoms impairing her from working- which is needed to continue her disability.

## 2011-09-21 NOTE — Telephone Encounter (Signed)
I returned call to [facility] and left message for Okey Dupre that I was faxing updated disability papers. We have approved her time out of work to extend through 09/15/11. She may call back if she has further questions.

## 2011-10-28 IMAGING — US US OB FOLLOW-UP
1 series · 12 of 28 positions shown · non-contrast
Comparison: none

[Series 1: us ob follow up · 12 of 44 slices shown]
[im 2/44]
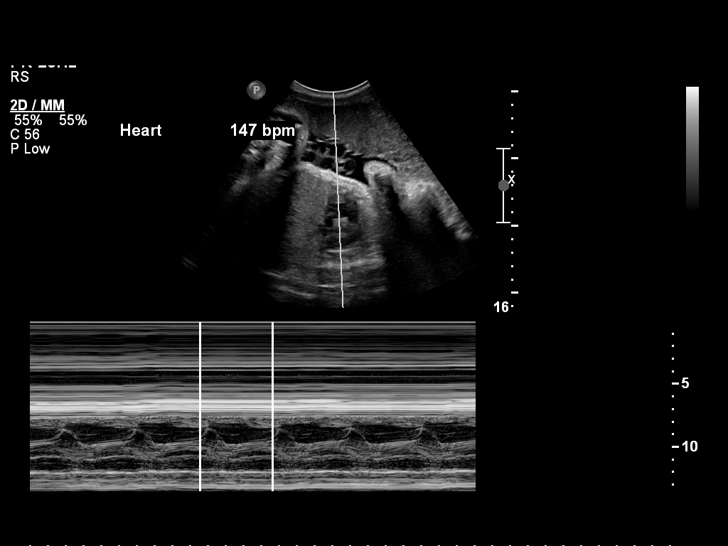
[im 5/44]
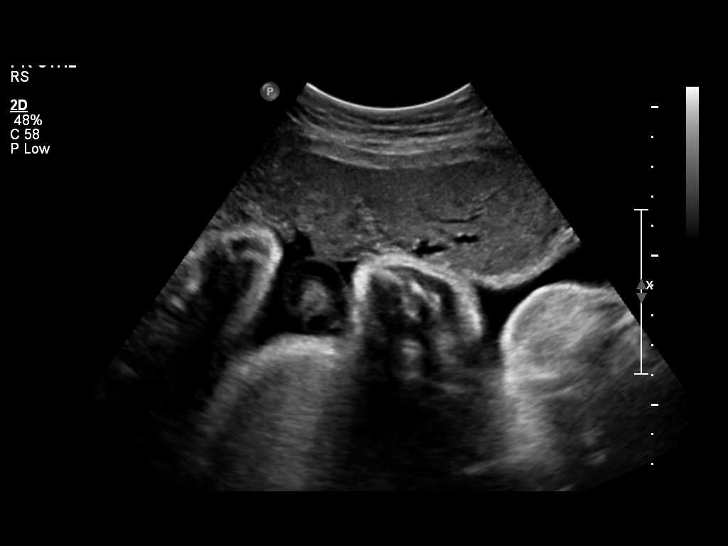
[im 8/44]
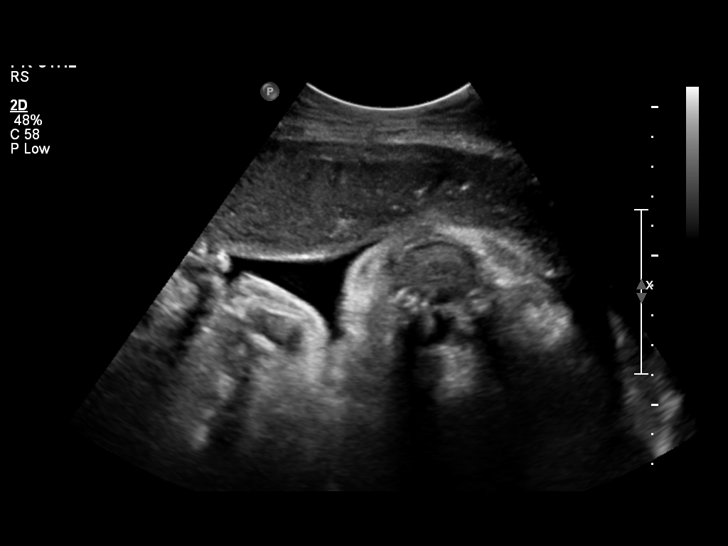
[im 13/44]
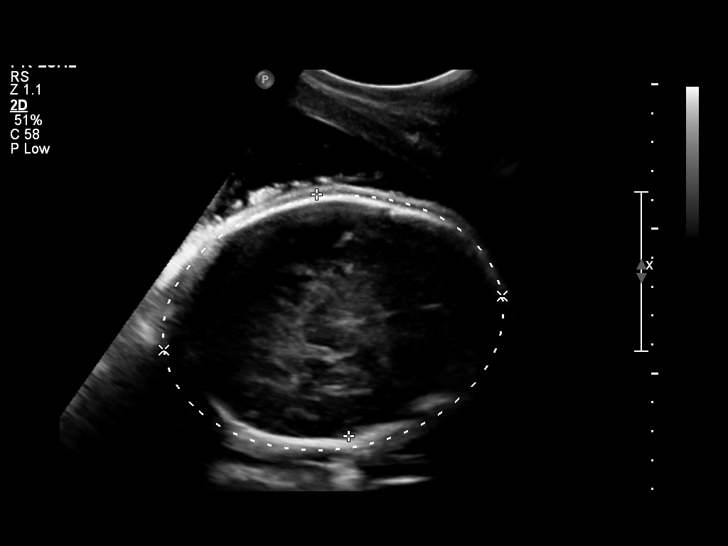
[im 16/44]
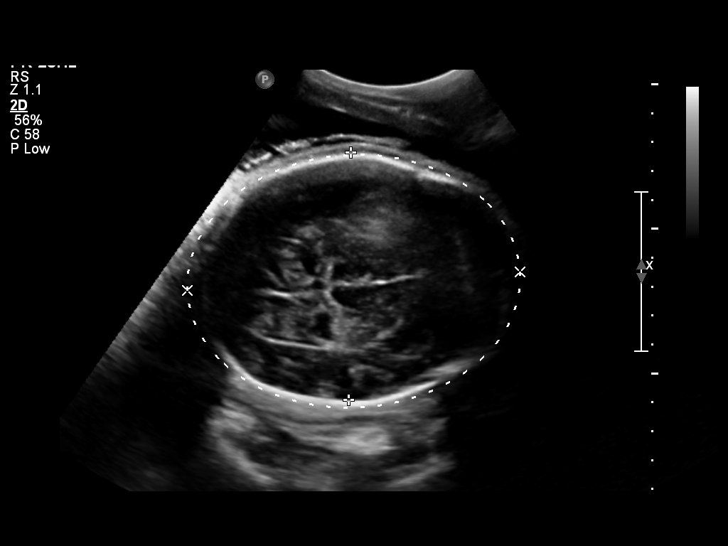
[im 20/44]
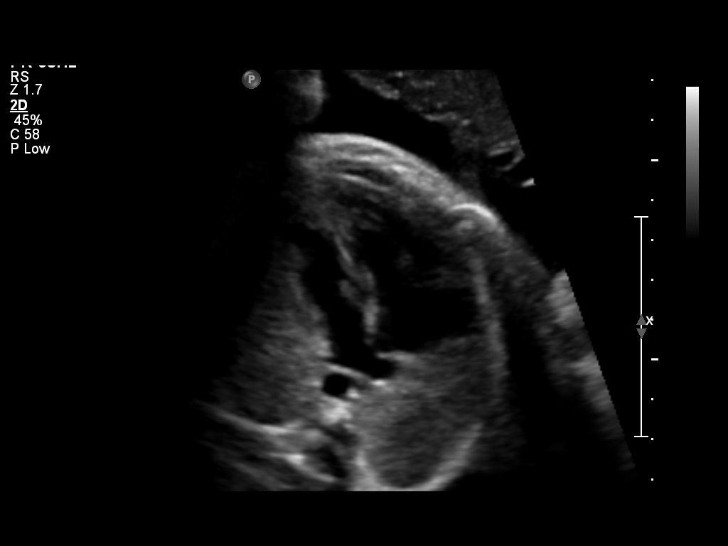
[im 24/44]
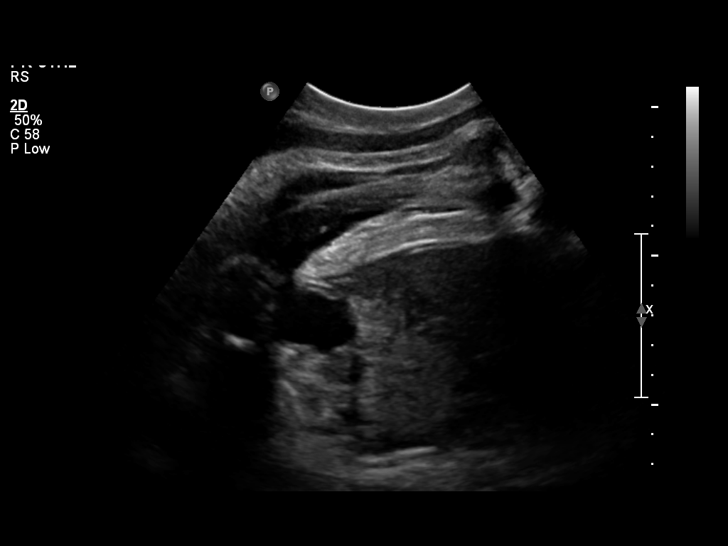
[im 28/44]
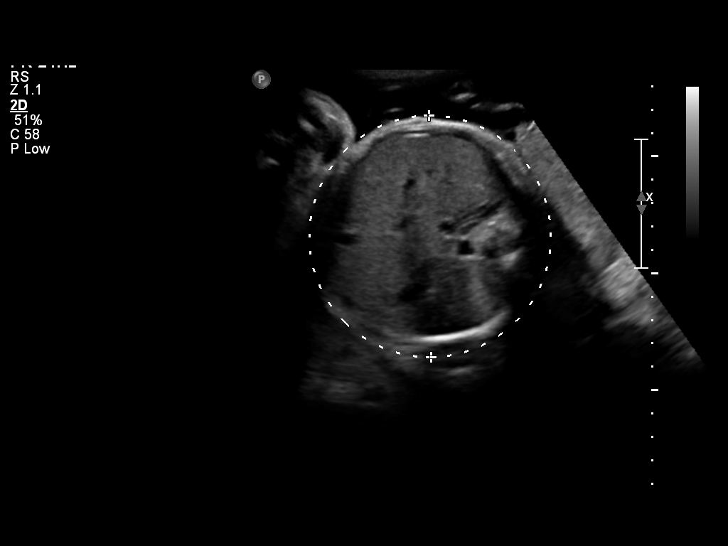
[im 31/44]
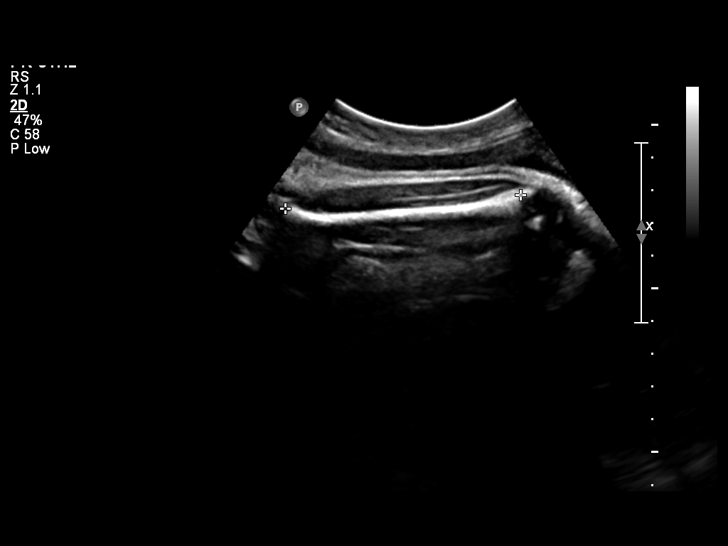
[im 36/44]
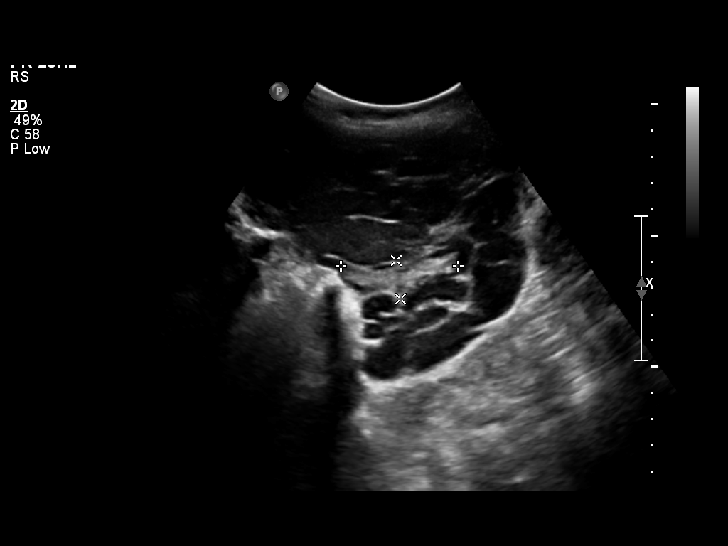
[im 39/44]
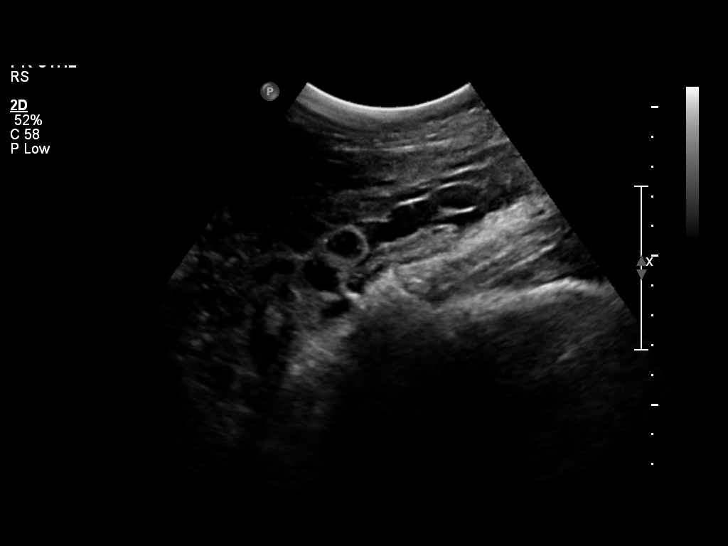
[im 42/44]
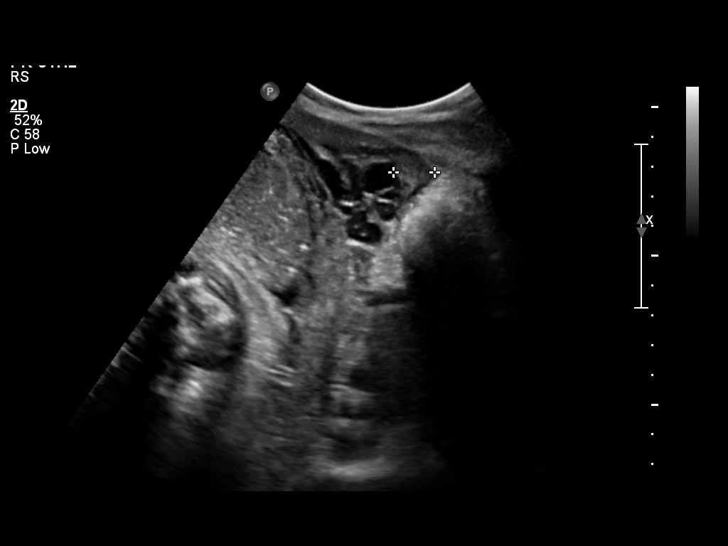

[12 of 28 positions shown; findings below may reference images not displayed]

OBSTETRICS REPORT
                      (Signed Final 11/12/2010 [DATE])

                 Hospital Clinic-
                 Faculty Physician
 Order#:         _O
Procedures

 US OB FOLLOW UP                                       76816.1
Indications

 Late Prenatal Care
 Size less than dates (Small for gestational [AGE]
 FGR)
 Assess Fetal Growth / Estimated Fetal Weight
Fetal Evaluation

 Fetal Heart Rate:  147                          bpm
 Cardiac Activity:  Observed
 Presentation:      Cephalic
 Placenta:          Anterior Left lateral,
                    above cervical os
 P. Cord            Previously Visualized
 Insertion:

 Amniotic Fluid
 AFI FV:      Subjectively upper-normal
 AFI Sum:     20.11   cm       77  %Tile     Larg Pckt:    8.58  cm
 RUQ:   5.36    cm   RLQ:    3.57   cm    LUQ:   2.6     cm   LLQ:    8.58   cm
Biometry

 BPD:     85.5  mm     G. Age:  34w 3d                CI:        70.89   70 - 86
                                                      FL/HC:      21.6   20.8 -

 HC:     323.6  mm     G. Age:  36w 4d       23  %    HC/AC:      0.99   0.92 -

 AC:     325.9  mm     G. Age:  36w 3d       60  %    FL/BPD:     81.9   71 - 87
 FL:        70  mm     G. Age:  35w 6d       31  %    FL/AC:      21.5   20 - 24

 Est. FW:    1025  gm      6 lb 5 oz     57  %
Gestational Age

 LMP:           36w 4d        Date:  03/01/10                 EDD:   12/06/10
 U/S Today:     35w 6d                                        EDD:   12/11/10
 Best:          36w 4d     Det. By:  LMP  (03/01/10)          EDD:   12/06/10
Anatomy

 Cranium:           Appears normal      Aortic Arch:       Previously seen
 Fetal Cavum:       Previously seen     Ductal Arch:       Previously seen
 Ventricles:        Appears normal      Diaphragm:         Appears normal
 Choroid Plexus:    Previously seen     Stomach:           Appears normal
 Cerebellum:        Previously seen     Abdomen:           Appears normal
 Posterior Fossa:   Previously seen     Abdominal Wall:    Previously seen
 Nuchal Fold:       Not applicable      Cord Vessels:      Previously seen
                    (>20 wks GA)
 Face:              Lips previously     Kidneys:           Appear normal
                    seen
 Heart:             Appears normal      Bladder:           Appears normal
                    (4 chamber &
                    axis)
 RVOT:              Previously seen     Spine:             Previously seen
 LVOT:              Previously seen     Limbs:             Previously seen

 Other:     Female gender previously seen. Heels and 5th digit
            previously seen. Profile appears normal.
Cervix Uterus Adnexa

 Cervix:       Not visualized (advanced GA >34 wks)
 Left Ovary:    Within normal limits.
 Right Ovary:   Within normal limits.

 Adnexa:     No abnormality visualized.
Impression

 Single intrauterine gestation demonstrating an estimated
 gestational age by ultrasound of 35w 6d. This correlates well
 with expected estimated gestational age by LMP of 36w 4d.
 EFW is currently at the 57%.

 No late developing fetal anatomic abnormalities are noted
 associated with the lateral ventricles, four chamber heart,
 stomach, kidneys or bladder.

 Subjectively and quantitatively normal amniotic fluid volume.

 questions or concerns.

## 2012-10-08 ENCOUNTER — Encounter (HOSPITAL_COMMUNITY): Payer: Self-pay

## 2012-10-08 ENCOUNTER — Inpatient Hospital Stay (HOSPITAL_COMMUNITY)
Admission: AD | Admit: 2012-10-08 | Discharge: 2012-10-08 | Disposition: A | Discharge status: Home / Self Care | Payer: BC Managed Care – PPO | Source: Ambulatory Visit | Attending: Obstetrics & Gynecology | Admitting: Obstetrics & Gynecology

## 2012-10-08 DIAGNOSIS — O219 Vomiting of pregnancy, unspecified: Secondary | ICD-10-CM

## 2012-10-08 DIAGNOSIS — O210 Mild hyperemesis gravidarum: Secondary | ICD-10-CM | POA: Insufficient documentation

## 2012-10-08 LAB — URINALYSIS, ROUTINE W REFLEX MICROSCOPIC
Protein, ur: NEGATIVE mg/dL
Urobilinogen, UA: 0.2 mg/dL (ref 0.0–1.0)

## 2012-10-08 LAB — POCT PREGNANCY, URINE: Preg Test, Ur: POSITIVE — AB

## 2012-10-08 LAB — URINE MICROSCOPIC-ADD ON

## 2012-10-08 MED ORDER — ONDANSETRON HCL 4 MG PO TABS: 4.0000 mg | ORAL_TABLET | Freq: Four times a day (QID) | ORAL | Status: DC

## 2012-10-08 MED ORDER — ONDANSETRON HCL 4 MG PO TABS
4.0000 mg | ORAL_TABLET | Freq: Once | ORAL | Status: AC
  Administered 2012-10-08: 4 mg via ORAL
  Filled 2012-10-08: qty 1

## 2012-10-08 MED ORDER — PROMETHAZINE HCL 25 MG PO TABS: 25.0000 mg | ORAL_TABLET | Freq: Four times a day (QID) | ORAL | Status: DC | PRN

## 2012-10-08 NOTE — MAU Provider Note (Signed)
  History     CSN: [id number]  Arrival date and time: 10/08/12 1551   None     Chief Complaint  Patient presents with  . Emesis  . Dizziness   HPI 36 y.o. [id number] at [redacted]w[redacted]d with nausea/vomiting x 2 weeks. Vomited up blood yesterday - only a few specs. Feeling weak, dizzy. Able to keep fluids and some food down. LMP 08/13/12. No vaginal bleeding but did have some spotting a couple of weeks ago.  Hx 2 prior c-sections for HSV.  Se[facility] at [facility] last two pregnancies. Not a planned pregnancy, considering termination.  OB History    Grav Para Term Preterm Abortions TAB SAB Ect Mult Living   4 2 2  1 1    2       Past Medical History  Diagnosis Date  . Depression   . HSV-2 (herpes simplex virus 2) infection   . Depression 08/04/2011  . Depression 08/04/2011    Past Surgical History  Procedure Date  . Tonsillectomy   . Cesarean section     Family History  Problem Relation Age of Onset  . Heart disease Mother   . Heart disease Maternal Grandmother   . Hypertension Maternal Grandmother   . Cancer Maternal Grandfather     multiple myeloma  . Hypertension Paternal Grandmother   . Diabetes Paternal Grandmother     History  Substance Use Topics  . Smoking status: Current Every Day Smoker -- 1.0 packs/day for 19 years  . Smokeless tobacco: Never Used  . Alcohol Use: No    Allergies: No Known Allergies  Prescriptions prior to admission  Medication Sig Dispense Refill  . lamoTRIgine (LAMICTAL) 25 MG tablet Take 25 mg by mouth daily.          Review of Systems  Constitutional: Negative for fever and chills.  Eyes: Negative for blurred vision and double vision.  Respiratory: Negative for shortness of breath.   Cardiovascular: Negative for chest pain.  Gastrointestinal: Positive for nausea and vomiting. Negative for heartburn and abdominal pain.  Genitourinary: Negative for dysuria, urgency and frequency.  Musculoskeletal: Positive for back pain.  Neurological:  Positive for dizziness and weakness. Negative for headaches.   Physical Exam   Blood pressure 111/58, pulse 77, temperature 98.1 F (36.7 C), temperature source Oral, resp. rate 16, height 5' 7.5" (1.715 m), weight 61.054 kg (134 lb 9.6 oz), last menstrual period 08/13/2012, SpO2 100.00%.  Physical Exam  Constitutional: She is oriented to person, place, and time. She appears well-developed and well-nourished. No distress.  HENT:  Head: Normocephalic and atraumatic.  Eyes: EOM are normal.  Neck: Normal range of motion. Neck supple.  Cardiovascular: Normal rate, regular rhythm and normal heart sounds.   Respiratory: Effort normal. No respiratory distress.  GI: Soft. Bowel sounds are normal. She exhibits no distension. There is no tenderness. There is no rebound and no guarding.  Musculoskeletal: Normal range of motion. She exhibits no edema and no tenderness.  Neurological: She is alert and oriented to person, place, and time.  Skin: Skin is warm and dry.  Psychiatric: She has a normal mood and affect.    MAU Course  Procedures  Bedside sono shows IUP with heartbeat.   Assessment and Plan  36 y.o. [id number] at [redacted]w[redacted]d by LMP with NV:  Zofran, phenergan IUP:  Pt needs to establish care if decides to continue with pregnancy.  Napoleon Form 10/08/2012, 5:37 PM

## 2012-10-08 NOTE — MAU Note (Signed)
Patient is in with c/o n/v and dizziness. Denies abdominal pain, or vaginal bleeding or discharge.

## 2012-10-08 NOTE — MAU Note (Signed)
Patient states she has had some vomiting for about 2 weeks. Has been feeling dizzy. Late for period. Denies any bleeding or discharge. Has aching in arms today, was in legs a few days ago.

## 2012-12-12 HISTORY — PX: DILATION AND CURETTAGE OF UTERUS: SHX78

## 2012-12-14 ENCOUNTER — Inpatient Hospital Stay (HOSPITAL_COMMUNITY)
Admission: AD | Admit: 2012-12-14 | Discharge: 2012-12-14 | Disposition: A | Discharge status: Home / Self Care | Payer: BC Managed Care – PPO | Source: Ambulatory Visit | Attending: Obstetrics & Gynecology | Admitting: Obstetrics & Gynecology

## 2012-12-14 ENCOUNTER — Encounter (HOSPITAL_COMMUNITY): Payer: Self-pay | Admitting: *Deleted

## 2012-12-14 DIAGNOSIS — O209 Hemorrhage in early pregnancy, unspecified: Secondary | ICD-10-CM | POA: Insufficient documentation

## 2012-12-14 LAB — WET PREP, GENITAL
Clue Cells Wet Prep HPF POC: NONE SEEN
Trich, Wet Prep: NONE SEEN
Yeast Wet Prep HPF POC: NONE SEEN

## 2012-12-14 NOTE — MAU Note (Signed)
Has abortion scheduled for next Friday.

## 2012-12-14 NOTE — MAU Provider Note (Signed)
History     CSN: [id number]  Arrival date and time: 12/14/12 0865   First Provider Initiated Contact with Patient 12/14/12 0913      Chief Complaint  Patient presents with  . Vaginal Bleeding   HPI [name] is a 37 y.o. G4 P2012 at [redacted]w[redacted]d who presents to MAU today for evaluation of vaginal bleeding. The patient states that the bleeding started on Wednesday after she had intercourse. It has been mild since then. She has had to change her pad about 2-3x/day but is not soaking pads at any time. She also has had a "slight cramping" during this same time period. The patient denies any other abnormal discharge or fevers. She has had N/V but this is unchanged from earlier in pregnancy. She was seen in MAU recently for the N/V and given Phenergan which has helped. The patient has an abortion scheduled for next Friday.   OB History    Grav Para Term Preterm Abortions TAB SAB Ect Mult Living   4 2 2  1 1    2       Past Medical History  Diagnosis Date  . Depression   . HSV-2 (herpes simplex virus 2) infection   . Depression 08/04/2011  . Depression 08/04/2011    Past Surgical History  Procedure Date  . Tonsillectomy   . Cesarean section     Family History  Problem Relation Age of Onset  . Heart disease Mother   . Heart disease Maternal Grandmother   . Hypertension Maternal Grandmother   . Cancer Maternal Grandfather     multiple myeloma  . Hypertension Paternal Grandmother   . Diabetes Paternal Grandmother     History  Substance Use Topics  . Smoking status: Current Every Day Smoker -- 1.0 packs/day for 19 years    Types: Cigarettes  . Smokeless tobacco: Never Used  . Alcohol Use: No    Allergies: No Known Allergies  Prescriptions prior to admission  Medication Sig Dispense Refill  . acetaminophen (TYLENOL) 325 MG tablet Take 650 mg by mouth every 6 (six) hours as needed. Tooth ache      . ondansetron (ZOFRAN) 4 MG tablet Take 1 tablet (4 mg total) by mouth  every 6 (six) hours.  12 tablet  0  . promethazine (PHENERGAN) 25 MG tablet Take 1 tablet (25 mg total) by mouth every 6 (six) hours as needed for nausea.  30 tablet  2    ROS All negative unless otherwise noted in HPI Physical Exam   Blood pressure 108/55, pulse 75, temperature 98.5 F (36.9 C), temperature source Oral, resp. rate 18, height 5' 7.5" (1.715 m), weight 154 lb (69.854 kg), last menstrual period 08/13/2012.  Physical Exam  Constitutional: She is oriented to person, place, and time. She appears well-developed and well-nourished. No distress.  HENT:  Head: Normocephalic and atraumatic.  Cardiovascular: Normal rate, regular rhythm and normal heart sounds.   Respiratory: Effort normal and breath sounds normal. No respiratory distress.  GI: Soft. Bowel sounds are normal. She exhibits no distension and no mass. There is no tenderness. There is no rebound and no guarding.  Genitourinary: Vagina normal. Uterus is enlarged (appropriate for gestational age). Uterus is not tender. Cervix exhibits discharge (very small amount of dark blood seen near the cervix; none noted in the os). Cervix exhibits no motion tenderness and no friability.  Neurological: She is oriented to person, place, and time.  Skin: Skin is warm and dry. No erythema.  Psychiatric: She has a normal mood and affect.   Results for orders placed during the hospital encounter of 12/14/12 (from the past 24 hour(s))  WET PREP, GENITAL     Status: Abnormal   Collection Time   12/14/12  9:18 AM      Component Value Range   Yeast Wet Prep HPF POC NONE SEEN  NONE SEEN   Trich, Wet Prep NONE SEEN  NONE SEEN   Clue Cells Wet Prep HPF POC NONE SEEN  NONE SEEN   WBC, Wet Prep HPF POC FEW (*) NONE SEEN    MAU Course  Procedures  MDM Wet prep, GC/Chlamydia obtained today. Patient is planning termination of pregnancy for next Friday. Patient is hemodynamically stable today.  Discussed patient with Dr. Macon Large. She agrees  with plan.    Assessment and Plan  A: Bleeding in pregnancy  P: Discharge home GC/Chlamydia pending Bleeding precautions discussed Patient to return to MAU if her condition should change or worsen   Freddi Starr, PA-C 12/14/2012, 9:34 AM

## 2012-12-14 NOTE — MAU Provider Note (Signed)
Attestation of Attending Supervision of Advanced Practitioner (PA/CNM/NP): Evaluation and management procedures were performed by the Advanced Practitioner under my supervision and collaboration.  I have reviewed the Advanced Practitioner's note and chart, and I agree with the management and plan.  Litisha Guagliardo, MD, FACOG Attending Obstetrician & Gynecologist Faculty Practice, [facility]  

## 2012-12-14 NOTE — MAU Note (Signed)
Started bleeding on Wed morning.  Off and on since.  Had intercourse Wed morning prior to onset of bleeding. Some cramping.

## 2012-12-15 LAB — GC/CHLAMYDIA PROBE AMP: CT Probe RNA: NEGATIVE

## 2013-02-28 ENCOUNTER — Encounter: Payer: Self-pay | Admitting: Family

## 2013-02-28 ENCOUNTER — Ambulatory Visit (INDEPENDENT_AMBULATORY_CARE_PROVIDER_SITE_OTHER): Payer: BC Managed Care – PPO | Admitting: Family

## 2013-02-28 VITALS — BP 117/69 | HR 68 | Temp 97.5°F | Ht 67.0 in | Wt 157.7 lb

## 2013-02-28 DIAGNOSIS — Z01419 Encounter for gynecological examination (general) (routine) without abnormal findings: Secondary | ICD-10-CM

## 2013-02-28 LAB — CBC
MCH: 26.8 pg (ref 26.0–34.0)
MCHC: 32.3 g/dL (ref 30.0–36.0)
MCV: 83.1 fL (ref 78.0–100.0)
Platelets: 272 10*3/uL (ref 150–400)

## 2013-02-28 LAB — TSH: TSH: 0.422 u[IU]/mL (ref 0.350–4.500)

## 2013-02-28 NOTE — Progress Notes (Signed)
  Subjective:     Latoya Dean is a 37 y.o. female here for a routine exam and f/u after elective AB in January.  Current complaints: depression.  Personal health questionnaire reviewed: yes.  Had D&C for elective abortion in January of 2014 without complications.  First menstrual cycle afterwards started 02/19/13. No bleeding, pain, fever, chills, or n/v.  Reports some depression since the procedure. Has history of bipolar depression, not medicated currently. Has psychiatrist who she plans on following up with for this. No suicidal/homicidal ideations.    Gynecologic History Patient's last menstrual period was 02/20/2012. Contraception: none and wants Depo. Stopped OCPs r/t spotting. Last Pap: 10/2010. Results were: negative. No HPV cotesting done.  GC/CT testing in Jan 2014 negative  Obstetric History OB History   Grav Para Term Preterm Abortions TAB SAB Ect Mult Living   4 2 2  1 1    2      # Outc Date GA Lbr Len/2nd Wgt Sex Del Anes PTL Lv   1 TRM 9/05    M LTCS Spinal  Yes   2 TRM 12/11    F LTCS   Yes   3 TAB            4 GRA            Comments: System Generated. Please review and update pregnancy details.        Review of Systems Constitutional: positive for fatigue Respiratory: negative Cardiovascular: negative Gastrointestinal: negative Genitourinary:negative    Objective:    General appearance: alert and cooperative Lungs: clear to auscultation bilaterally Breasts: normal appearance, no masses or tenderness, No nipple discharge or bleeding, No axillary or supraclavicular adenopathy, Normal to palpation without dominant masses Heart: regular rate and rhythm, S1, S2 normal, no murmur, click, rub or gallop Abdomen: soft, non-tender; bowel sounds normal; no masses,  no organomegaly Pelvic: external genitalia normal, no adnexal masses or tenderness, no cervical motion tenderness, uterus normal size, shape, and consistency and vagina normal without discharge     Assessment:    Healthy female exam.  Depression S/P elective abortion   Plan:  CBC, TSH  Education reviewed: safe sex/STD prevention. Contraception: abstain from intercourse for 2 weeks then follow up in clinic for Depo. Follow up in: 2 weeks.for Depo and Pap with HPV

## 2013-03-14 ENCOUNTER — Encounter: Payer: Self-pay | Admitting: Medical

## 2013-03-14 ENCOUNTER — Ambulatory Visit (INDEPENDENT_AMBULATORY_CARE_PROVIDER_SITE_OTHER): Payer: BC Managed Care – PPO | Admitting: Medical

## 2013-03-14 VITALS — BP 115/73 | HR 55 | Temp 97.3°F | Ht 67.0 in | Wt 157.9 lb

## 2013-03-14 DIAGNOSIS — Z309 Encounter for contraceptive management, unspecified: Secondary | ICD-10-CM

## 2013-03-14 DIAGNOSIS — Z3049 Encounter for surveillance of other contraceptives: Secondary | ICD-10-CM

## 2013-03-14 DIAGNOSIS — Z01419 Encounter for gynecological examination (general) (routine) without abnormal findings: Secondary | ICD-10-CM

## 2013-03-14 MED ORDER — MEDROXYPROGESTERONE ACETATE 150 MG/ML IM SUSP
150.0000 mg | INTRAMUSCULAR | Status: AC
  Administered 2013-03-14 – 2013-08-26 (×2): 150 mg via INTRAMUSCULAR

## 2013-03-14 NOTE — Patient Instructions (Signed)

## 2013-03-14 NOTE — Progress Notes (Signed)
Patient ID: Latoya Dean, female   DOB: March 04, 1976, 37 y.o.   MRN: [id number] Subjective:    Latoya Dean is a 37 y.o. female who presents for an annual exam. The patient has no complaints today. The patient is sexually active. GYN screening history: last pap: approximate date 10/2010 and was normal. The patient wears seatbelts: yes. The patient participates in regular exercise: not asked. Has the patient ever been transfused or tattooed?: not asked. The patient reports that there is not domestic violence in her life. The patient is interested in Depo provera for birth control today. She has not had intercourse x 2 weeks as advised. LMP 03/14/13. She does not desire any STD testing at this time.   Menstrual History: OB History   Grav Para Term Preterm Abortions TAB SAB Ect Mult Living   4 2 2  2 2    2       Patient's last menstrual period was 02/19/2013.    The following portions of the patient's history were reviewed and updated as appropriate: allergies, current medications, past family history, past medical history, past social history, past surgical history and problem list.  Review of Systems Pertinent items are noted in HPI.    Objective:     BP 115/73  Pulse 55  Temp(Src) 97.3 F (36.3 C) (Oral)  Ht 5\' 7"  (1.702 m)  Wt 157 lb 14.4 oz (71.623 kg)  BMI 24.72 kg/m2  LMP 02/19/2013  Breastfeeding? No GENERAL: Well-developed, well-nourished female in no acute distress.  HEENT: Normocephalic, atraumatic. Sclerae anicteric.  LUNGS: Clear to auscultation bilaterally.  HEART: Regular rate and rhythm. BREASTS: Symmetric in size. No masses, skin changes, nipple drainage, or lymphadenopathy. ABDOMEN: Soft, nontender, nondistended. No organomegaly. PELVIC: Normal external female genitalia. Vagina is pink and rugated.  Normal discharge. Normal cervix contour. Pap smear obtained. Uterus is normal in size. No adnexal mass or tenderness.  EXTREMITIES: No cyanosis, clubbing, or  edema.  .    Assessment:    Healthy female exam.  Contraceptive counseling   Plan:     1. Patient will be contacted with any abnormal results  2. Patient will return in 12 weeks for Depo provera 3. Annual exam in one year, patient may return to clinic sooner PRN

## 2013-03-21 ENCOUNTER — Telehealth: Payer: Self-pay | Admitting: *Deleted

## 2013-03-21 NOTE — Telephone Encounter (Addendum)
Pt spoke with Latoya Dean earlier today and stated that she needs her FMLA papers completed and they are due today. I asked Latoya Dean to give pt a message that I would call her back later today. I called and left message that I am retuning her call and will call her again tomorrow. **Pt needs to be informed that we cannot authorize FMLA because she does not have a medical condition which warrants such a leave from work. Pt had been informed by Latoya Dean on 03/14/13 that we would check to see if we can complete them for the reason of depression because that was the pt's complaint. (Pt has hx of depression, has been treated for it in the past and feels she is depressed after having TAB in January) After discussing with clinic manager Latoya Dean, Latoya Dean, pt will need to be informed that if she wants FMLA for the reason of depression, the papers will need to be completed by the provider that is treating her depression. Our office is not providing this treatment, therefore we are not able to authorize FMLA.  4/11 1145- Spoke w/pt and explained that we will not be able to complete her FMLA papers and the reason why. Pt stated that our office had completed FMLA papers for her after the delivery of her last baby due to PP depression. I explained that we were able to do that because we had initiated her PP depression treatment and care. This situation is different. I asked if she has seen her psychiatrist which she had planned on doing per notes from Latoya Dean CNM student on 02/28/13. Pt stated that she has not. I encouraged her to schedule appt with psychiatrist ASAP.  Pt agreed and voiced understanding of all information discussed. Pt requested that her FMLA papers be returned to her by mail.

## 2013-03-27 NOTE — MAU Provider Note (Signed)
At the end of the patient's visit she presented me with FMLA papers and requested that our office complete them for the time she missed from work due to [facility] depression following a TAB that was performed at another facility. I explained to the patient that this may not be possible as we did not perform the TAB or follow her for PP depression. She does not endorse any ongoing symptoms at this time. She assumed we would complete the paperwork as we had written the letter for her to return to work and had completed FMLA paperwork when she had PP depression after the birth of her last child, although we delivered that child. FMLA papers were taken and given to Diane Day, RN for further evaluation of the patient situation. The patient is aware that we will be in contact to let her know whether we can complete the paperwork or whether it will need to be completed by the facility that performed the TAB. The patient voiced understanding.   Freddi Starr, PA-C  03/27/2013 8:39 AM

## 2013-05-05 ENCOUNTER — Emergency Department (HOSPITAL_COMMUNITY)
Admission: EM | Admit: 2013-05-05 | Discharge: 2013-05-05 | Disposition: A | Discharge status: Home / Self Care | Payer: BC Managed Care – PPO | Attending: Emergency Medicine | Admitting: Emergency Medicine

## 2013-05-05 ENCOUNTER — Encounter (HOSPITAL_COMMUNITY): Payer: Self-pay | Admitting: *Deleted

## 2013-05-05 DIAGNOSIS — K047 Periapical abscess without sinus: Secondary | ICD-10-CM | POA: Insufficient documentation

## 2013-05-05 DIAGNOSIS — F3289 Other specified depressive episodes: Secondary | ICD-10-CM | POA: Insufficient documentation

## 2013-05-05 DIAGNOSIS — Z8619 Personal history of other infectious and parasitic diseases: Secondary | ICD-10-CM | POA: Insufficient documentation

## 2013-05-05 DIAGNOSIS — F329 Major depressive disorder, single episode, unspecified: Secondary | ICD-10-CM | POA: Insufficient documentation

## 2013-05-05 DIAGNOSIS — Z79899 Other long term (current) drug therapy: Secondary | ICD-10-CM | POA: Insufficient documentation

## 2013-05-05 DIAGNOSIS — H9209 Otalgia, unspecified ear: Secondary | ICD-10-CM | POA: Insufficient documentation

## 2013-05-05 DIAGNOSIS — F17200 Nicotine dependence, unspecified, uncomplicated: Secondary | ICD-10-CM | POA: Insufficient documentation

## 2013-05-05 MED ORDER — PENICILLIN V POTASSIUM 500 MG PO TABS: 500.0000 mg | ORAL_TABLET | Freq: Four times a day (QID) | ORAL | Status: AC

## 2013-05-05 MED ORDER — HYDROCODONE-ACETAMINOPHEN 5-325 MG PO TABS
1.0000 | ORAL_TABLET | Freq: Once | ORAL | Status: AC
  Administered 2013-05-05: 1 via ORAL
  Filled 2013-05-05: qty 1

## 2013-05-05 MED ORDER — HYDROCODONE-ACETAMINOPHEN 5-325 MG PO TABS: 1.0000 | ORAL_TABLET | ORAL | Status: DC | PRN

## 2013-05-05 NOTE — ED Notes (Signed)
Pt is here with left dental pain ? Abscess and pain radiates to left ear

## 2013-05-05 NOTE — ED Provider Notes (Signed)
History    This chart was scribed for Trixie Dredge PA-C, a non-physician practitioner working with Donnetta Hutching, MD by Lewanda Rife, ED Scribe. This patient was seen in room TR08C/TR08C and the patient's care was started at 1808.     CSN: [id number]  Arrival date & time 05/05/13  1730   None     Chief Complaint  Patient presents with  . Dental Pain    (Consider location/radiation/quality/duration/timing/severity/associated sxs/prior treatment) The history is provided by the patient.   HPI Comments: Latoya Dean is a 37 y.o. female who presents to the Emergency Department complaining of constant severe upper left dental pain radiating to left ear onset 4 days. Reports pain is aggravated with eating and alleviated by nothing. Denies associated fever, chills, dysphagia, and sore throat. Denies taking any OTC medications to relieve symptoms.  Past Medical History  Diagnosis Date  . Depression   . HSV-2 (herpes simplex virus 2) infection   . Depression 08/04/2011  . Depression 08/04/2011    Past Surgical History  Procedure Laterality Date  . Tonsillectomy    . Cesarean section    . Dilation and curettage of uterus  Jan 2014    Family History  Problem Relation Age of Onset  . Heart disease Mother   . Heart disease Maternal Grandmother   . Hypertension Maternal Grandmother   . Cancer Maternal Grandfather     multiple myeloma  . Hypertension Paternal Grandmother   . Diabetes Paternal Grandmother     History  Substance Use Topics  . Smoking status: Current Every Day Smoker -- 1.00 packs/day for 19 years    Types: Cigarettes  . Smokeless tobacco: Never Used  . Alcohol Use: No    OB History   Grav Para Term Preterm Abortions TAB SAB Ect Mult Living   4 2 2  2 2    2       Review of Systems  Constitutional: Negative for fever and chills.  HENT: Positive for ear pain and dental problem. Negative for sore throat and trouble swallowing.   Respiratory: Negative  for shortness of breath.     Allergies  Review of patient's allergies indicates no known allergies.  Home Medications   Current Outpatient Rx  Name  Route  Sig  Dispense  Refill  . MedroxyPROGESTERone Acetate (DEPO-PROVERA IM)   Intramuscular   Inject into the muscle every 3 (three) months.           BP 132/78  Pulse 81  Temp(Src) 98.7 F (37.1 C) (Oral)  Resp 18  SpO2 97%  Physical Exam  Nursing note and vitals reviewed. Constitutional: She appears well-developed and well-nourished. No distress.  HENT:  Head: Normocephalic and atraumatic.    Mouth/Throat: Oropharynx is clear and moist. No oropharyngeal exudate.    Neck: Trachea normal, normal range of motion and phonation normal. Neck supple. No tracheal tenderness present. No tracheal deviation present.  Pulmonary/Chest: Effort normal. No stridor.  Lymphadenopathy:    She has no cervical adenopathy.  Neurological: She is alert.  Skin: She is not diaphoretic.    ED Course  Procedures (including critical care time) Medications - No data to display  Labs Reviewed - No data to display No results found.   1. Dental abscess     MDM  Pt with left upper molar tenderness and adjacent swelling - likely abscess.  No airway concerns.  Pt is afebrile, nontoxic.  D/C home with abx, pain medication, dental follow up.  Discussed  findings, treatment, follow up with patient.  Pt given return precautions.  Pt verbalizes understanding and agrees with plan.     I doubt any other EMC precluding discharge at this time including, but not necessarily limited to the following: deep space neck infection such as ludwig's angina.    I personally performed the services described in this documentation, which was scribed in my presence. The recorded information has been reviewed and is accurate.   Trixie Dredge, PA-C 05/05/13 2015

## 2013-05-05 NOTE — Discharge Instructions (Signed)
Read the information below.  Use the prescribed medication as directed.  Please discuss all new medications with your pharmacist.  Do not take additional tylenol while taking the prescribed pain medication to avoid overdose.  You may return to the Emergency Department at any time for worsening condition or any new symptoms that concern you.  Please call the dentist listed above within 48 hours to schedule a close follow up appointment.  If you develop fevers, swelling in your face, difficulty swallowing or breathing, return to the ER immediately for a recheck.  ° ° °Dental Abscess °A dental abscess is a collection of infected fluid (pus) from a bacterial infection in the inner part of the tooth (pulp). It usually occurs at the end of the tooth's root.  °CAUSES  °· Severe tooth decay. °· Trauma to the tooth that allows bacteria to enter into the pulp, such as a broken or chipped tooth. °SYMPTOMS  °· Severe pain in and around the infected tooth. °· Swelling and redness around the abscessed tooth or in the mouth or face. °· Tenderness. °· Pus drainage. °· Bad breath. °· Bitter taste in the mouth. °· Difficulty swallowing. °· Difficulty opening the mouth. °· Nausea. °· Vomiting. °· Chills. °· Swollen neck glands. °DIAGNOSIS  °· A medical and dental history will be taken. °· An examination will be performed by tapping on the abscessed tooth. °· X-rays may be taken of the tooth to identify the abscess. °TREATMENT °The goal of treatment is to eliminate the infection. You may be prescribed antibiotic medicine to stop the infection from spreading. A root canal may be performed to save the tooth. If the tooth cannot be saved, it may be pulled (extracted) and the abscess may be drained.  °HOME CARE INSTRUCTIONS °· Only take over-the-counter or prescription medicines for pain, fever, or discomfort as directed by your caregiver. °· Rinse your mouth (gargle) often with salt water (¼ tsp salt in 8 oz [250 ml] of warm water) to  relieve pain or swelling. °· Do not drive after taking pain medicine (narcotics). °· Do not apply heat to the outside of your face. °· Return to your dentist for further treatment as directed. °SEEK MEDICAL CARE IF: °· Your pain is not helped by medicine. °· Your pain is getting worse instead of better. °SEEK IMMEDIATE MEDICAL CARE IF: °· You have a fever or persistent symptoms for more than 2 3 days. °· You have a fever and your symptoms suddenly get worse. °· You have chills or a very bad headache. °· You have problems breathing or swallowing. °· You have trouble opening your mouth. °· You have swelling in the neck or around the eye. °Document Released: 11/28/2005 Document Revised: 08/22/2012 Document Reviewed: 03/08/2011 °ExitCare® Patient Information ©2014 [employer]. ° °

## 2013-05-05 NOTE — ED Provider Notes (Signed)
Medical screening examination/treatment/procedure(s) were performed by non-physician practitioner and as supervising physician I was immediately available for consultation/collaboration.  Chadric Kimberley, MD 05/05/13 2349 

## 2013-05-15 ENCOUNTER — Ambulatory Visit (HOSPITAL_COMMUNITY)
Admission: RE | Admit: 2013-05-15 | Discharge: 2013-05-15 | Disposition: A | Discharge status: Home / Self Care | Payer: BC Managed Care – PPO | Attending: Psychiatry | Admitting: Psychiatry

## 2013-05-15 DIAGNOSIS — F329 Major depressive disorder, single episode, unspecified: Secondary | ICD-10-CM | POA: Insufficient documentation

## 2013-05-15 DIAGNOSIS — F3289 Other specified depressive episodes: Secondary | ICD-10-CM | POA: Insufficient documentation

## 2013-05-16 ENCOUNTER — Other Ambulatory Visit (HOSPITAL_COMMUNITY): Payer: BC Managed Care – PPO | Attending: Psychiatry | Admitting: Psychiatry

## 2013-05-16 ENCOUNTER — Encounter (HOSPITAL_COMMUNITY): Payer: Self-pay

## 2013-05-16 DIAGNOSIS — F3181 Bipolar II disorder: Secondary | ICD-10-CM

## 2013-05-16 DIAGNOSIS — F411 Generalized anxiety disorder: Secondary | ICD-10-CM

## 2013-05-16 DIAGNOSIS — F329 Major depressive disorder, single episode, unspecified: Secondary | ICD-10-CM | POA: Insufficient documentation

## 2013-05-16 DIAGNOSIS — F3289 Other specified depressive episodes: Secondary | ICD-10-CM | POA: Insufficient documentation

## 2013-05-16 MED ORDER — ESCITALOPRAM OXALATE 20 MG PO TABS: 20.0000 mg | ORAL_TABLET | Freq: Every day | ORAL | Status: DC
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
## 2013-05-16 NOTE — Progress Notes (Signed)
    Daily Group Progress Note  Program: IOP  Group Time: 9:00-10:30 am   Participation Level: Active  Behavioral Response: Appropriate  Type of Therapy:  Process Group  Summary of Progress: Today was Pts first day in the group. She was introduced, attentive and observed the group process.     Group Time: 10:30 am - 12:00 pm   Participation Level:  Active  Behavioral Response: Appropriate  Type of Therapy: Psycho-education Group  Summary of Progress: Pt learned the DBT skill of Distress Tolerance by identify ways to distract from stress using the tool Accepts.  Carman Ching, LCSW
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
## 2013-05-17 ENCOUNTER — Other Ambulatory Visit (HOSPITAL_COMMUNITY): Payer: BC Managed Care – PPO | Attending: Psychiatry | Admitting: Psychiatry

## 2013-05-17 DIAGNOSIS — F329 Major depressive disorder, single episode, unspecified: Secondary | ICD-10-CM | POA: Insufficient documentation

## 2013-05-17 DIAGNOSIS — F3289 Other specified depressive episodes: Secondary | ICD-10-CM | POA: Insufficient documentation

## 2013-05-17 NOTE — Progress Notes (Signed)
    Daily Group Progress Note  Program: IOP  Group Time: 9:00-10:30 am   Participation Level: Active  Behavioral Response: Appropriate  Type of Therapy:  Process Group  Summary of Progress: Pt reports depression and shared about childhood traumas that impact trouble with lust today. Pt also talked financial stress and filing for bankruptcy.      Group Time: 10:30 am - 12:00 pm   Participation Level:  Active  Behavioral Response: Appropriate  Type of Therapy: Psycho-education Group  Summary of Progress: Pt learned the DBT skills of distress tolerance in Improving the moment and self soothing to reduce and mange stress.   Carman Ching, LCSW

## 2013-05-20 ENCOUNTER — Other Ambulatory Visit (HOSPITAL_COMMUNITY): Payer: BC Managed Care – PPO | Attending: Psychiatry

## 2013-05-20 DIAGNOSIS — F329 Major depressive disorder, single episode, unspecified: Secondary | ICD-10-CM
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
## 2013-05-21 ENCOUNTER — Other Ambulatory Visit (HOSPITAL_COMMUNITY): Payer: BC Managed Care – PPO | Attending: Psychiatry

## 2013-05-21 DIAGNOSIS — F329 Major depressive disorder, single episode, unspecified: Secondary | ICD-10-CM

## 2013-05-22 ENCOUNTER — Other Ambulatory Visit (HOSPITAL_COMMUNITY): Payer: BC Managed Care – PPO | Attending: Psychiatry

## 2013-05-22 ENCOUNTER — Encounter (HOSPITAL_COMMUNITY): Payer: Self-pay

## 2013-05-22 DIAGNOSIS — F329 Major depressive disorder, single episode, unspecified: Secondary | ICD-10-CM

## 2013-05-22 NOTE — Progress Notes (Unsigned)
Daily Group Progress Note  Program: IOP  [facility]          [facility] Outpatient Department              Daily Group Progress Notes  Program: Psych IOP           Time Start: 0900  Participation Level:       ____Minimal          _X_Active       ____ None    Participation Quality    __X__Appropriate   __X__ Attentive   __ _Sharing     __ _ Supportive       ____Intrusive                                         ____Monopolizing  ____ Resistant     __  __Drowsy     ____ Other:____________ Affect:      __X__Appropriate     ____Excited     _  __ Anxious      __ __ Depressed   ____Labile         _ _Angry Cognitive: __X_ Appropriate    _ __Oriented ____ Confused   __  __Alert  _____Delusional  ____Hallucinating Insight/Engagement in Therapy:   ____None         _ ___ Minimal      _ X__ Alliance-Forming Modes of Intervention: ____Clarification         __X_ Exploration           ____Limit-setting           ____Orientation                                          _ _Reality-Testing       _ _ _Confrontation        ____Role-Play               ____Validation                                          __ _Support           __ __Education        ___ Problem-solving    ____Socialization  Type of Therapy:  Process Group Summary of Progress/ Problems Addressed:   Pt. participated in group process. Participated in [place] intervention. Pt. indicated thumb between up and midway during nonverbal check-in. Pt. shared challenge of fear and developing trust in relationships. Pt. shared success of sharing her feelings with her husband and recognition of the importance of modeling honesty for her children.     Staff Signature/Credentials   _____Jennifer Manson Passey, Ph.D., NCC, LPC__________________________                             Time Start: 1045  Participation Level:       __ __Minimal          _X_Active       __ None    Participation Quality    __X_Appropriate   _X__ Attentive     _ __Sharing  ____ Supportive       ____Intrusive                                         ____Monopolizing  __ __ Resistant     __  __Drowsy     ____ Other:___________________  Affect:      _X__Appropriate     ____Excited     _  ___ Anxious      ___ Depressed   ____Labile         Gilda Crease Cognitive: __ X__ Appropriate    ____ Oriented    ____ Confused   [name] _____Delusional   ____Hallucinating Insight/Engagement in Therapy:   ____None         _ __ Minimal      __X__ Alliance-Forming Modes of Intervention: _ __Clarification         ___X_Exploration           ____Limit-setting           ____Orientation                                           ___Reality-Testing       _ ___Confrontation        ____Role-Play               ____Validation                                          _  __ Support           __X __Education        _ __ Problem-solving    ____Socialization  Type of Therapy:  PsychoEd   Summary of Progress/Problems Addressed:  Pt. participated in discussion about the stigma associated with mental illness and how the absence of vulnerability in the workplace contributes to work-related distress.     Staff Signature/Credentials ______Jennifer Manson Passey, Ph.D., NCC, LPC_________________________                Time Ended:  12 Noon      Time Ended:  1030   [facility]

## 2013-05-23 ENCOUNTER — Encounter (HOSPITAL_COMMUNITY): Payer: Self-pay

## 2013-05-23 ENCOUNTER — Other Ambulatory Visit (HOSPITAL_COMMUNITY): Payer: BC Managed Care – PPO | Attending: Psychiatry

## 2013-05-23 DIAGNOSIS — F329 Major depressive disorder, single episode, unspecified: Secondary | ICD-10-CM

## 2013-05-23 NOTE — Progress Notes (Unsigned)
[facility]          [facility] Outpatient Department              Daily Group Progress Notes  Program: Psych IOP            Date:      05/20/2013     Time Start: 0900  Participation Level:       ____Minimal          _X_Active       ____ None    Participation Quality    __X__Appropriate   __X__ Attentive   __ _Sharing     __ _ Supportive       ____Intrusive                                         ____Monopolizing  ____ Resistant     __  __Drowsy     ____ Other:____________ Affect:      __X__Appropriate     ____Excited     _  __ Anxious      __ __ Depressed   ____Labile         _ _Angry Cognitive: __X_ Appropriate    _ __Oriented ____ Confused   __  __Alert  _____Delusional  ____Hallucinating Insight/Engagement in Therapy:   ____None         _ ___ Minimal      _ X__ Alliance-Forming Modes of Intervention: ____Clarification         __X_ Exploration           ____Limit-setting           ____Orientation                                          _ _Reality-Testing       _ _ _Confrontation        ____Role-Play               ____Validation                                          __ _Support           __ __Education        ___ Problem-solving    ____Socialization  Type of Therapy:  Process Group Summary of Progress/ Problems Addressed:   Pt. participated in group process. Participated in [name] intervention. Pt. indicated thumb between up and midway during nonverbal check-in. Pt. shared challenge of fear and developing trust in relationships. Pt. shared success of sharing her feelings with her husband and recognition of the importance of modeling honesty for her children.     Staff Signature/Credentials   _____Jennifer Manson Passey, Ph.D., NCC, LPC__________________________             Time Ended:  1030                                Date:05/20/2013 Time Start: 1045  Participation Level:       __ __Minimal          _X_Active  __ None    Participation Quality     __X_Appropriate   _X__ Attentive    _ __Sharing     ____ Supportive       ____Intrusive                                         ____Monopolizing  __ __ Resistant     __  __Drowsy     ____ Other:___________________  Affect:      _X__Appropriate     ____Excited     _  ___ Anxious      ___ Depressed   ____Labile         _  _Angry Cognitive: __ X__ Appropriate    ____ Oriented    ____ Confused   [name] _____Delusional   ____Hallucinating Insight/Engagement in Therapy:   ____None         _ __ Minimal      __X__ Alliance-Forming Modes of Intervention: _ __Clarification         ___X_Exploration           ____Limit-setting           ____Orientation                                           ___Reality-Testing       _ ___Confrontation        ____Role-Play               ____Validation                                          _  __ Support           __X __Education        _ __ Problem-solving    ____Socialization  Type of Therapy:  PsychoEd   Summary of Progress/Problems Addressed:  Pt. participated in discussion about the stigma associated with mental illness and how the absence of vulnerability in the workplace contributes to work-related distress.     Staff Signature/Credentials ______Jennifer Manson Passey, Ph.D., NCC, LPC_________________________                Time Ended:  12 Noon

## 2013-05-23 NOTE — Progress Notes (Unsigned)
      Daily Group Progress Note  Program: IOP  Group Time: 9:00  Participation Level: Active  Behavioral Response: Appropriate  Type of Therapy:  Group Therapy  Summary of Progress: Pt. Participated in group process and heartmath intervention.  Focused on themes related to grief and loss and mental health.     Group Time: 10:45  Participation Level:  Active  Behavioral Response: Appropriate  Type of Therapy: Psycho-education Group  Summary of Progress: Pt. Participated in education about diaphragmatic breathing, guided meditation, and influence on mental health.  Boneta Lucks, Ph.D., [facility], [facility] 05/23/13

## 2013-05-23 NOTE — Progress Notes (Unsigned)
    Daily Group Progress Note  Program: IOP  Group Time: 9:00  Participation Level: Active  Behavioral Response: Appropriate and Sharing  Type of Therapy:  Group Therapy  Summary of Progress: Pt. Participated in group process and heartmath intervention. Focus on themes related to self-acceptance and boundaries in relationships. Pt. Shared success of interaction on public transportation with exercising healthy boundary with a stranger.     Group Time: 10:45  Participation Level:  Active  Behavioral Response: Appropriate and Sharing  Type of Therapy: Psycho-education Group  Summary of Progress: Pt. Participated in group on definition of mindfulness, applications of mindfulness and relevance to recovery.  Boneta Lucks, Ph.D., [facility], [facility] 05/23/13

## 2013-05-24 ENCOUNTER — Telehealth (HOSPITAL_COMMUNITY): Payer: Self-pay | Admitting: Psychiatry

## 2013-05-24 ENCOUNTER — Other Ambulatory Visit (HOSPITAL_COMMUNITY): Payer: Self-pay

## 2013-05-27 ENCOUNTER — Other Ambulatory Visit (HOSPITAL_COMMUNITY): Payer: BC Managed Care – PPO | Attending: Psychiatry | Admitting: Psychiatry

## 2013-05-27 DIAGNOSIS — F332 Major depressive disorder, recurrent severe without psychotic features: Secondary | ICD-10-CM | POA: Insufficient documentation

## 2013-05-27 DIAGNOSIS — F329 Major depressive disorder, single episode, unspecified: Secondary | ICD-10-CM

## 2013-05-27 DIAGNOSIS — F411 Generalized anxiety disorder: Secondary | ICD-10-CM | POA: Insufficient documentation

## 2013-05-27 NOTE — Progress Notes (Signed)
    Daily Group Progress Note  Program: IOP  Group Time: 9:00-10:30 am   Participation Level: Active  Behavioral Response: Appropriate  Type of Therapy:  Process Group  Summary of Progress: Pt reports feeling improved mood and decreased depression. Pt is more talkative and engaged with improved affect. Pt continues to work on feelings of lust that she is learning to manage that stem from childhood experiences from her mother.      Group Time: 10:30 am - 12:00 pm   Participation Level:  Active  Behavioral Response: Appropriate  Type of Therapy: Psycho-education Group  Summary of Progress: Pt participated in a group on grief and loss and identified healthy ways to grieve losses they are currently experiencing.   Carman Ching, LCSW

## 2013-05-28 ENCOUNTER — Other Ambulatory Visit (HOSPITAL_COMMUNITY): Payer: BC Managed Care – PPO | Attending: Psychiatry | Admitting: Psychiatry

## 2013-05-28 DIAGNOSIS — F329 Major depressive disorder, single episode, unspecified: Secondary | ICD-10-CM | POA: Insufficient documentation

## 2013-05-28 DIAGNOSIS — F3289 Other specified depressive episodes: Secondary | ICD-10-CM | POA: Insufficient documentation

## 2013-05-28 NOTE — Progress Notes (Signed)
    Daily Group Progress Note  Program: IOP  Group Time: 9:00-10:30 am   Participation Level: Active  Behavioral Response: Appropriate  Type of Therapy:  Process Group  Summary of Progress: Pt reports feeling "good" today. Pt is acting as a leader within the group and appears to have more confidence. Pt has improved affect and is smiling and engaged in conversation. Pt states she still struggles with feeling guilty over past mistakes and is working on finding acceptance for herself instead of shaming herself.      Group Time: 10:30 am - 12:00 pm   Participation Level:  Active  Behavioral Response: Appropriate  Type of Therapy: Psycho-education Group  Summary of Progress: Pt learned about the importance of setting personal boundaries to ensure optimal wellness and was introduced to the different personality styles from the boundary book "When to say yes, when to say no and when to take control back of your life".  Carman Ching, LCSW

## 2013-05-29 ENCOUNTER — Other Ambulatory Visit (HOSPITAL_COMMUNITY): Payer: BC Managed Care – PPO | Attending: Psychiatry | Admitting: Psychiatry

## 2013-05-29 DIAGNOSIS — F411 Generalized anxiety disorder: Secondary | ICD-10-CM | POA: Insufficient documentation

## 2013-05-29 DIAGNOSIS — F329 Major depressive disorder, single episode, unspecified: Secondary | ICD-10-CM

## 2013-05-29 DIAGNOSIS — F332 Major depressive disorder, recurrent severe without psychotic features: Secondary | ICD-10-CM | POA: Insufficient documentation

## 2013-05-29 NOTE — Progress Notes (Signed)
    Daily Group Progress Note  Program: IOP  Group Time: 9:00-10:30 am   Participation Level: Active  Behavioral Response: Appropriate  Type of Therapy:  Process Group  Summary of Progress: Pt reports today being her 37th birthday and she said she feels better this year on her birthday than she has in a long time. Pt talked about the good things she is grateful for in her life and trying to appreciate them more. Pt states she is learning more about herself and how to be a happier person. Pt still worries about returning to work and managing work stress, but states she is learning tools to use upon her return to help with the stress.      Group Time: 10:30 am - 12:00 pm   Participation Level:  Active  Behavioral Response: Appropriate  Type of Therapy: Psycho-education Group  Summary of Progress: Pt participated in Part II of the boundaries eduction and learned barriers to setting healthy boundaries with others based on personality styles.   Carman Ching, LCSW

## 2013-05-30 ENCOUNTER — Other Ambulatory Visit (HOSPITAL_COMMUNITY): Payer: BC Managed Care – PPO | Attending: Psychiatry | Admitting: Psychiatry

## 2013-05-30 ENCOUNTER — Ambulatory Visit (INDEPENDENT_AMBULATORY_CARE_PROVIDER_SITE_OTHER): Payer: BC Managed Care – PPO

## 2013-05-30 VITALS — BP 112/74 | HR 102 | Ht 67.0 in | Wt 143.1 lb

## 2013-05-30 DIAGNOSIS — Z3049 Encounter for surveillance of other contraceptives: Secondary | ICD-10-CM

## 2013-05-30 DIAGNOSIS — F411 Generalized anxiety disorder: Secondary | ICD-10-CM | POA: Insufficient documentation

## 2013-05-30 DIAGNOSIS — F332 Major depressive disorder, recurrent severe without psychotic features: Secondary | ICD-10-CM | POA: Insufficient documentation

## 2013-05-30 DIAGNOSIS — F329 Major depressive disorder, single episode, unspecified: Secondary | ICD-10-CM

## 2013-05-30 NOTE — Progress Notes (Signed)
    Daily Group Progress Note  Program: IOP  Group Time: 9:00-10:30 am   Participation Level: Active  Behavioral Response: Appropriate  Type of Therapy:  Process Group  Summary of Progress: Patient reports stable mood and uplifted affect. Pt is making progress with managing her depression symptoms and is a positive support of others in the group.      Group Time: 10:30 am - 12:00 pm   Participation Level:  Active  Behavioral Response: Appropriate  Type of Therapy: Psycho-education Group  Summary of Progress: Patient learned about mental health support groups that can be accessed for continued support in addition to the IOP program and made an action plan on which ones to attend.   Carman Ching, LCSW

## 2013-05-31 ENCOUNTER — Other Ambulatory Visit (HOSPITAL_COMMUNITY): Payer: BC Managed Care – PPO | Attending: Psychiatry | Admitting: Psychiatry

## 2013-05-31 DIAGNOSIS — F3289 Other specified depressive episodes: Secondary | ICD-10-CM | POA: Insufficient documentation

## 2013-05-31 DIAGNOSIS — F329 Major depressive disorder, single episode, unspecified: Secondary | ICD-10-CM

## 2013-05-31 NOTE — Progress Notes (Signed)
    Daily Group Progress Note  Program: IOP  Group Time: 9:00-10:30 am   Participation Level: Active  Behavioral Response: Appropriate  Type of Therapy:  Process Group  Summary of Progress: Pt processed stress associated with filing for bankruptcy and trying to find a new home to rent. Pt said she is hoping to find a safe and affordable place for her and her family to live and it is proving challenging. Pt said she is more optimistic than when she started in the IOP program and that is helping reduce her depression.      Group Time: 10:30 am - 12:00 pm   Participation Level:  Active  Behavioral Response: Appropriate  Type of Therapy: Psycho-education Group  Summary of Progress: Pt participated in a goodbye ceremony to another member leaving and participated in continued discussion and education about how to set healthy boundaries with others to ensure wellness.   Carman Ching, LCSW

## 2013-06-03 ENCOUNTER — Other Ambulatory Visit (HOSPITAL_COMMUNITY): Payer: BC Managed Care – PPO | Attending: Psychiatry | Admitting: Psychiatry

## 2013-06-03 DIAGNOSIS — F329 Major depressive disorder, single episode, unspecified: Secondary | ICD-10-CM

## 2013-06-03 DIAGNOSIS — F411 Generalized anxiety disorder: Secondary | ICD-10-CM | POA: Insufficient documentation

## 2013-06-03 DIAGNOSIS — F332 Major depressive disorder, recurrent severe without psychotic features: Secondary | ICD-10-CM | POA: Insufficient documentation

## 2013-06-03 NOTE — Progress Notes (Signed)
    Daily Group Progress Note  Program: IOP  Group Time: 9:00-10:30 am   Participation Level: Active  Behavioral Response: Appropriate  Type of Therapy:  Process Group  Summary of Progress: Pt arrived thirty minutes late for group and appeared to have an increase stress level. Pt reports feeling 50 % improvement with her stressors that brought her to the IOP program. Pt provided an update on searching for a new home to rent for her and her children and said that she is still looking but with less intensity and is accepting that the "right home will come in time". Pt talked about her job stress and how she made an impulsive decision when she left [employer] several years ago and wishes she was still employed there instead of with [employer]. Pt is actively looking for new employment and states she needs financial stability but also wants to feel "respected".      Group Time: 10:30 am - 12:00 pm   Participation Level:  Active  Behavioral Response: Appropriate  Type of Therapy: Psycho-education Group  Summary of Progress: Pt participated in a group on grief and loss and identified losses impacting wellness and effective grieving strategies.   Carman Ching, LCSW

## 2013-06-04 ENCOUNTER — Other Ambulatory Visit (HOSPITAL_COMMUNITY): Payer: Self-pay

## 2013-06-04 ENCOUNTER — Telehealth (HOSPITAL_COMMUNITY): Payer: Self-pay | Admitting: Psychiatry

## 2013-06-05 ENCOUNTER — Telehealth (HOSPITAL_COMMUNITY): Payer: Self-pay | Admitting: Psychiatry

## 2013-06-05 ENCOUNTER — Other Ambulatory Visit (HOSPITAL_COMMUNITY): Payer: Self-pay

## 2013-06-06 ENCOUNTER — Other Ambulatory Visit (HOSPITAL_COMMUNITY): Payer: BC Managed Care – PPO | Attending: Psychiatry | Admitting: Psychiatry

## 2013-06-06 DIAGNOSIS — F3289 Other specified depressive episodes: Secondary | ICD-10-CM | POA: Insufficient documentation

## 2013-06-06 DIAGNOSIS — F329 Major depressive disorder, single episode, unspecified: Secondary | ICD-10-CM

## 2013-06-06 MED ORDER — ESCITALOPRAM OXALATE 20 MG PO TABS: 20.0000 mg | ORAL_TABLET | Freq: Every day | ORAL | Status: DC

## 2013-06-06 MED ORDER — HYDROXYZINE PAMOATE 100 MG PO CAPS: 50.0000 mg | ORAL_CAPSULE | Freq: Every day | ORAL | Status: DC

## 2013-06-06 NOTE — Progress Notes (Signed)
Patient ID: Latoya Dean, female   DOB: 14-Jun-1976, 37 y.o.   MRN: [id number] Patient reviewed and interviewed today, states she's been doing very well, sleep and appetite are good mood has been stable occasional anxiety related to situational events but has been coping well. Patient is requesting medications for her anxiety so I discussed the rationale risks benefits options of Vistaril 100 mg at bedtime and patient has given me her informed consent. Patient is tolerating her Lexapro 20 mg well and denies suicidal or homicidal ideation and has no hallucinations or delusions.

## 2013-06-06 NOTE — Progress Notes (Signed)
    Daily Group Progress Note  Program: IOP  Group Time: 9:00-10:30 am   Participation Level: None  Behavioral Response: none  Type of Therapy:  Process Group  Summary of Progress: Pt did not attend.     Group Time: 10:30 am - 12:00 pm   Participation Level:  Minimal  Behavioral Response: Appropriate  Type of Therapy: Psycho-education Group  Summary of Progress: Pt arrived at 11:15 am. Pt participated in an education segment on the symptoms of depression and how to identify the symptoms quickly to reduce depression and anxiety.   Carman Ching, LCSW

## 2013-06-07 ENCOUNTER — Other Ambulatory Visit (HOSPITAL_COMMUNITY): Payer: BC Managed Care – PPO | Attending: Psychiatry | Admitting: Psychiatry

## 2013-06-07 DIAGNOSIS — F329 Major depressive disorder, single episode, unspecified: Secondary | ICD-10-CM | POA: Insufficient documentation

## 2013-06-07 DIAGNOSIS — F3289 Other specified depressive episodes: Secondary | ICD-10-CM | POA: Insufficient documentation
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
## 2013-06-10 ENCOUNTER — Other Ambulatory Visit (HOSPITAL_COMMUNITY): Payer: Self-pay

## 2013-06-10 ENCOUNTER — Telehealth (HOSPITAL_COMMUNITY): Payer: Self-pay | Admitting: Psychiatry

## 2013-06-11 ENCOUNTER — Other Ambulatory Visit (HOSPITAL_COMMUNITY): Payer: BC Managed Care – PPO | Attending: Psychiatry | Admitting: Psychiatry

## 2013-06-11 DIAGNOSIS — F332 Major depressive disorder, recurrent severe without psychotic features: Secondary | ICD-10-CM | POA: Insufficient documentation

## 2013-06-11 DIAGNOSIS — F411 Generalized anxiety disorder: Secondary | ICD-10-CM | POA: Insufficient documentation

## 2013-06-11 DIAGNOSIS — F329 Major depressive disorder, single episode, unspecified: Secondary | ICD-10-CM
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
## 2013-06-11 NOTE — Progress Notes (Deleted)
  [facility] Intensive Outpatient Program Discharge Summary  Latoya Dean [id number]  Admission date: *** Discharge date: ***  Reason for admission: ***  Chemical Use History: ***  Family of Origin Issues: ***  Progress in Program Toward Treatment Goals: ***  Progress (rationale): ***    [facility] 06/11/2013
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
## 2013-06-11 NOTE — Patient Instructions (Signed)
Pt completed MH-IOP today.  Will follow up with Boneta Lucks, [facility] on 06-17-13 at 8 am and Dr. Lolly Mustache on 06-25-13 @ 11 am.  Return to work on 06-18-13.  Encouraged support groups.

## 2013-06-11 NOTE — Progress Notes (Signed)
Patient ID: Latoya Dean, female   DOB: [birth year], 38 y.o.   MRN: [id number] D:  This is a 37 yo single african [place] female, who was a self referral. Pt is well known to this writer due to being in MH-IOP in 2012. Pt states she returned due to worsening depressive and anxiety symptoms. Multiple stressors: 1) Unresolved grief/loss issues: Pt found out she was pregnant in the Fall 2013. She and boyfriend chose to abort the fetus in January 2014 due to financial issues. Pt states she is struggling with guilt feelings. 2) Job ([employer]) of one year. 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 "I am so bored with the job. I only took it because it paid more money." The job is located in [place]. Pt has been taking the bus there, therefore making it a longer commute. 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 Pt reports that she has been exploring other options. 3) Ongoing conflictual family relationship with parents, sister, and boyfriend of four years. 4) Financial Strain: Currently filing bankruptcy.  Pt completed MH-IOP today.  Reports overall mood improved, but continues to struggle with poor sleep, decreased concentration, and increased irritability.  States appetite is fine.  Denies any SI today, but admits to having SI over the weekend.  States the groups were very helpful.  "It helped to open up to others and hear their stories.  A:  D/C today.  F/U with Boneta Lucks, LPC on 06-17-13 and Dr. Lolly Mustache on 06-25-13.  RTW on 06-18-13 without any restrictions.  R:  Pt receptive.

## 2013-06-12 ENCOUNTER — Other Ambulatory Visit (HOSPITAL_COMMUNITY): Payer: BC Managed Care – PPO

## 2013-06-13 ENCOUNTER — Other Ambulatory Visit (HOSPITAL_COMMUNITY): Payer: Self-pay

## 2013-06-17 ENCOUNTER — Ambulatory Visit (INDEPENDENT_AMBULATORY_CARE_PROVIDER_SITE_OTHER): Payer: BC Managed Care – PPO | Admitting: Psychiatry

## 2013-06-17 ENCOUNTER — Other Ambulatory Visit (HOSPITAL_COMMUNITY): Payer: Self-pay

## 2013-06-17 ENCOUNTER — Telehealth (HOSPITAL_COMMUNITY): Payer: Self-pay | Admitting: Psychiatry

## 2013-06-17 ENCOUNTER — Telehealth (HOSPITAL_COMMUNITY): Payer: Self-pay | Admitting: *Deleted

## 2013-06-17 ENCOUNTER — Encounter (HOSPITAL_COMMUNITY): Payer: Self-pay | Admitting: Psychiatry

## 2013-06-17 DIAGNOSIS — F329 Major depressive disorder, single episode, unspecified: Secondary | ICD-10-CM
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
## 2013-06-18 ENCOUNTER — Emergency Department (HOSPITAL_COMMUNITY)
Admission: EM | Admit: 2013-06-18 | Discharge: 2013-06-18 | Disposition: A | Discharge status: Home / Self Care | Payer: BC Managed Care – PPO | Source: Home / Self Care | Attending: Family Medicine | Admitting: Family Medicine

## 2013-06-18 ENCOUNTER — Other Ambulatory Visit (HOSPITAL_COMMUNITY): Payer: Self-pay

## 2013-06-18 DIAGNOSIS — M65839 Other synovitis and tenosynovitis, unspecified forearm: Secondary | ICD-10-CM

## 2013-06-18 MED ORDER — DICLOFENAC POTASSIUM 50 MG PO TABS: 50.0000 mg | ORAL_TABLET | Freq: Three times a day (TID) | ORAL | Status: DC

## 2013-06-18 NOTE — ED Provider Notes (Signed)
History    CSN: [id number] Arrival date & time 06/18/13  1213  None    Chief Complaint  Patient presents with  . Arm Pain   (Consider location/radiation/quality/duration/timing/severity/associated sxs/prior Treatment) Patient is a 37 y.o. female presenting with arm injury. The history is provided by the patient.  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 Depression   . HSV-2 (herpes simplex virus 2) infection   . Depression 08/04/2011  . Depression 08/04/2011  . Anxiety   . Bipolar disorder    Past Surgical History  Procedure Laterality Date  . Tonsillectomy    . Cesarean section    . Dilation and curettage of uterus  Jan 2014   Family History  Problem Relation Age of Onset  . Heart disease Mother   . Bipolar disorder Mother   . Heart disease Maternal Grandmother   . Hypertension Maternal Grandmother   . Cancer Maternal Grandfather     multiple myeloma  . Hypertension Paternal Grandmother   . Diabetes Paternal Grandmother   . Bipolar disorder Maternal Aunt    History  Substance Use Topics  . Smoking status: Current Every Day Smoker -- 1.00 packs/day for 19 years    Types: Cigarettes  . Smokeless tobacco: Never Used  . Alcohol Use: 0.6 oz/week    1 Cans of beer per week   OB History   Grav Para Term Preterm Abortions TAB SAB Ect Mult Living   4 2 2  2 2    2      Review of Systems  Constitutional: Negative.   Musculoskeletal: Positive for myalgias. Negative for back pain, joint swelling and gait problem.    Skin: Negative.     Allergies  Review of patient's allergies indicates no known allergies.  Home Medications   Current Outpatient Rx  Name  Route  Sig  Dispense  Refill  . diclofenac (CATAFLAM) 50 MG tablet   Oral   Take 1 tablet (50 mg total) by mouth 3 (three) times daily. For arm pain   30 tablet   1   . escitalopram (LEXAPRO) 20 MG tablet   Oral   Take 1 tablet (20 mg total) by mouth daily.   30 tablet   0   . HYDROcodone-acetaminophen (NORCO/VICODIN) 5-325 MG per tablet   Oral   Take 1 tablet by mouth every 4 (four) hours as needed for pain.   15 tablet   0   . hydrOXYzine (VISTARIL) 100 MG capsule   Oral   Take 1 capsule (100 mg total) by mouth at bedtime.   30 capsule   0   . MedroxyPROGESTERone Acetate (DEPO-PROVERA IM)   Intramuscular   Inject into the muscle every 3 (three) months.          BP 122/80  Pulse 74  Resp 16  SpO2 100% Physical Exam  Nursing note and vitals reviewed. Constitutional: She is oriented to person, place, and time. She appears well-developed  and well-nourished.  Neck: Normal range of motion. Neck supple.  Musculoskeletal: She exhibits tenderness.       Left forearm: She exhibits tenderness. She exhibits no bony tenderness, no swelling, no edema and no deformity.       Arms: Lymphadenopathy:    She has no cervical adenopathy.  Neurological: She is alert and oriented to person, place, and time. No cranial nerve deficit. Coordination normal.  Skin: Skin is warm and dry.    ED Course  Procedures (including critical care time) Labs Reviewed - No data to display No results found. 1. Forearm tendonitis     MDM    Linna Hoff, MD 06/18/13 1325

## 2013-06-18 NOTE — ED Notes (Signed)
States she has left arm pain which flared up yesterday.  No pain medications taken.  States she thinks it is tendonitis which she has had before.

## 2013-06-19 ENCOUNTER — Other Ambulatory Visit (HOSPITAL_COMMUNITY): Payer: Self-pay

## 2013-06-19 NOTE — Telephone Encounter (Signed)
Error

## 2013-06-20 ENCOUNTER — Other Ambulatory Visit (HOSPITAL_COMMUNITY): Payer: Self-pay

## 2013-06-25 ENCOUNTER — Encounter (HOSPITAL_COMMUNITY): Payer: Self-pay

## 2013-06-25 ENCOUNTER — Encounter (HOSPITAL_COMMUNITY): Payer: Self-pay | Admitting: Psychiatry

## 2013-06-25 ENCOUNTER — Ambulatory Visit (HOSPITAL_COMMUNITY): Payer: Self-pay | Admitting: Psychiatry

## 2013-06-25 ENCOUNTER — Ambulatory Visit (INDEPENDENT_AMBULATORY_CARE_PROVIDER_SITE_OTHER): Payer: BC Managed Care – PPO | Admitting: Psychiatry

## 2013-06-25 VITALS — BP 128/84 | HR 77 | Ht 67.13 in | Wt 145.6 lb

## 2013-06-25 DIAGNOSIS — F329 Major depressive disorder, single episode, unspecified: Secondary | ICD-10-CM

## 2013-06-25 MED ORDER — LAMOTRIGINE 25 MG PO TABS: ORAL_TABLET | ORAL | Status: DC

## 2013-06-25 MED ORDER — HYDROXYZINE PAMOATE 100 MG PO CAPS: 100.0000 mg | ORAL_CAPSULE | Freq: Every day | ORAL | Status: DC

## 2013-06-25 MED ORDER — ESCITALOPRAM OXALATE 20 MG PO TABS: 20.0000 mg | ORAL_TABLET | Freq: Every day | ORAL | Status: DC
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
## 2013-07-01 ENCOUNTER — Ambulatory Visit (HOSPITAL_COMMUNITY): Payer: Self-pay | Admitting: Psychiatry

## 2013-07-19 ENCOUNTER — Ambulatory Visit (HOSPITAL_COMMUNITY): Payer: Self-pay | Admitting: Psychiatry

## 2013-08-15 ENCOUNTER — Ambulatory Visit: Payer: Self-pay

## 2013-08-19 ENCOUNTER — Ambulatory Visit: Payer: Self-pay

## 2013-08-26 ENCOUNTER — Ambulatory Visit (INDEPENDENT_AMBULATORY_CARE_PROVIDER_SITE_OTHER): Payer: BC Managed Care – PPO | Admitting: General Practice

## 2013-08-26 VITALS — BP 113/79 | HR 77 | Temp 98.7°F | Ht 67.0 in | Wt 142.3 lb

## 2013-08-26 DIAGNOSIS — Z3049 Encounter for surveillance of other contraceptives: Secondary | ICD-10-CM

## 2013-08-26 DIAGNOSIS — IMO0001 Reserved for inherently not codable concepts without codable children: Secondary | ICD-10-CM

## 2013-11-11 ENCOUNTER — Ambulatory Visit: Payer: Self-pay

## 2013-11-25 ENCOUNTER — Ambulatory Visit (INDEPENDENT_AMBULATORY_CARE_PROVIDER_SITE_OTHER): Payer: BC Managed Care – PPO

## 2013-11-25 VITALS — BP 117/79 | HR 72 | Temp 97.8°F | Ht 67.0 in | Wt 151.0 lb

## 2013-11-25 DIAGNOSIS — B3731 Acute candidiasis of vulva and vagina: Secondary | ICD-10-CM

## 2013-11-25 DIAGNOSIS — Z3049 Encounter for surveillance of other contraceptives: Secondary | ICD-10-CM

## 2013-11-25 DIAGNOSIS — B373 Candidiasis of vulva and vagina: Secondary | ICD-10-CM

## 2013-11-25 MED ORDER — FLUCONAZOLE 150 MG PO TABS: 150.0000 mg | ORAL_TABLET | Freq: Once | ORAL | Status: DC

## 2013-11-25 MED ORDER — MEDROXYPROGESTERONE ACETATE 104 MG/0.65ML ~~LOC~~ SUSP
104.0000 mg | Freq: Once | SUBCUTANEOUS | Status: AC
  Administered 2013-11-25: 104 mg via SUBCUTANEOUS

## 2013-11-25 NOTE — Progress Notes (Signed)
Pt. Came in today for depo-provera. Stated she also thinks she has a yeast infection- has some white milky discharge that is a little itchy; similar to her symptoms of previous yeast infections. Informed pt. I would go ahead and prescribe Diflucan and that she should see a relief of symptoms in about 3 days but if in a week she is still having symptoms to come in and be seen. Pt. Verbalizes understanding and gratitude. No other questions or concerns today.

## 2013-11-30 ENCOUNTER — Emergency Department (HOSPITAL_COMMUNITY): Payer: BC Managed Care – PPO

## 2013-11-30 ENCOUNTER — Encounter (HOSPITAL_COMMUNITY): Payer: Self-pay | Admitting: Emergency Medicine

## 2013-11-30 ENCOUNTER — Emergency Department (HOSPITAL_COMMUNITY)
Admission: EM | Admit: 2013-11-30 | Discharge: 2013-11-30 | Disposition: A | Discharge status: Home / Self Care | Payer: BC Managed Care – PPO | Attending: Emergency Medicine | Admitting: Emergency Medicine

## 2013-11-30 DIAGNOSIS — S0003XA Contusion of scalp, initial encounter: Secondary | ICD-10-CM | POA: Insufficient documentation

## 2013-11-30 DIAGNOSIS — S298XXA Other specified injuries of thorax, initial encounter: Secondary | ICD-10-CM | POA: Insufficient documentation

## 2013-11-30 DIAGNOSIS — F17200 Nicotine dependence, unspecified, uncomplicated: Secondary | ICD-10-CM | POA: Insufficient documentation

## 2013-11-30 DIAGNOSIS — F319 Bipolar disorder, unspecified: Secondary | ICD-10-CM | POA: Insufficient documentation

## 2013-11-30 DIAGNOSIS — Z8619 Personal history of other infectious and parasitic diseases: Secondary | ICD-10-CM | POA: Insufficient documentation

## 2013-11-30 DIAGNOSIS — F411 Generalized anxiety disorder: Secondary | ICD-10-CM | POA: Insufficient documentation

## 2013-11-30 DIAGNOSIS — R0789 Other chest pain: Secondary | ICD-10-CM

## 2013-11-30 DIAGNOSIS — S0093XA Contusion of unspecified part of head, initial encounter: Secondary | ICD-10-CM

## 2013-11-30 DIAGNOSIS — Z79899 Other long term (current) drug therapy: Secondary | ICD-10-CM | POA: Insufficient documentation

## 2013-11-30 MED ORDER — HYDROCODONE-ACETAMINOPHEN 5-325 MG PO TABS: 2.0000 | ORAL_TABLET | ORAL | Status: DC | PRN
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
## 2014-02-17 ENCOUNTER — Ambulatory Visit: Payer: Self-pay

## 2014-03-24 ENCOUNTER — Ambulatory Visit: Payer: Self-pay

## 2014-04-02 ENCOUNTER — Ambulatory Visit (INDEPENDENT_AMBULATORY_CARE_PROVIDER_SITE_OTHER): Payer: Medicaid Other | Admitting: *Deleted

## 2014-04-02 DIAGNOSIS — Z3049 Encounter for surveillance of other contraceptives: Secondary | ICD-10-CM

## 2014-04-02 DIAGNOSIS — Z309 Encounter for contraceptive management, unspecified: Secondary | ICD-10-CM

## 2014-04-02 DIAGNOSIS — Z01812 Encounter for preprocedural laboratory examination: Secondary | ICD-10-CM

## 2014-04-02 NOTE — Progress Notes (Signed)
Pt here today for Depo Provera injection.  Pt missed allowed time frame and required pregnancy test.  Pt reports having unprotected sex in the last 2 weeks.  Pt's pregnancy test today is NEGATIVE, will return in 2 weeks to repeat pregnancy test.  If at that time it is negative, we will precede with Depo Provera injection.  Informed patient of need to have protected sex for the next 2 weeks and to return for birth control.

## 2014-04-16 ENCOUNTER — Ambulatory Visit: Payer: Self-pay

## 2014-04-28 ENCOUNTER — Ambulatory Visit (INDEPENDENT_AMBULATORY_CARE_PROVIDER_SITE_OTHER): Payer: Medicaid Other | Admitting: *Deleted

## 2014-04-28 VITALS — BP 99/68 | HR 63 | Temp 97.9°F | Wt 172.7 lb

## 2014-04-28 DIAGNOSIS — IMO0001 Reserved for inherently not codable concepts without codable children: Secondary | ICD-10-CM

## 2014-04-28 DIAGNOSIS — Z3049 Encounter for surveillance of other contraceptives: Secondary | ICD-10-CM

## 2014-04-28 LAB — POCT PREGNANCY, URINE: PREG TEST UR: NEGATIVE

## 2014-04-28 MED ORDER — MEDROXYPROGESTERONE ACETATE 104 MG/0.65ML ~~LOC~~ SUSP
104.0000 mg | Freq: Once | SUBCUTANEOUS | Status: AC
  Administered 2014-04-28: 104 mg via SUBCUTANEOUS

## 2014-07-21 ENCOUNTER — Ambulatory Visit (INDEPENDENT_AMBULATORY_CARE_PROVIDER_SITE_OTHER): Payer: Medicaid Other | Admitting: General Practice

## 2014-07-21 VITALS — BP 130/65 | HR 63 | Temp 98.4°F | Ht 67.0 in | Wt 171.6 lb

## 2014-07-21 DIAGNOSIS — Z3042 Encounter for surveillance of injectable contraceptive: Secondary | ICD-10-CM

## 2014-07-21 DIAGNOSIS — Z3049 Encounter for surveillance of other contraceptives: Secondary | ICD-10-CM

## 2014-07-21 MED ORDER — MEDROXYPROGESTERONE ACETATE 104 MG/0.65ML ~~LOC~~ SUSP
104.0000 mg | Freq: Once | SUBCUTANEOUS | Status: AC
  Administered 2014-07-21: 104 mg via SUBCUTANEOUS

## 2014-10-13 ENCOUNTER — Encounter (HOSPITAL_COMMUNITY): Payer: Self-pay | Admitting: Emergency Medicine

## 2014-10-24 ENCOUNTER — Ambulatory Visit: Payer: Self-pay

## 2014-10-27 ENCOUNTER — Ambulatory Visit: Payer: Self-pay

## 2015-02-05 ENCOUNTER — Encounter (HOSPITAL_COMMUNITY): Payer: Self-pay

## 2015-02-05 ENCOUNTER — Emergency Department (HOSPITAL_COMMUNITY)
Admission: EM | Admit: 2015-02-05 | Discharge: 2015-02-05 | Disposition: A | Discharge status: Home / Self Care | Payer: 59 | Attending: Emergency Medicine | Admitting: Emergency Medicine

## 2015-02-05 DIAGNOSIS — J069 Acute upper respiratory infection, unspecified: Secondary | ICD-10-CM | POA: Diagnosis not present

## 2015-02-05 DIAGNOSIS — Z79899 Other long term (current) drug therapy: Secondary | ICD-10-CM | POA: Insufficient documentation

## 2015-02-05 DIAGNOSIS — Z8619 Personal history of other infectious and parasitic diseases: Secondary | ICD-10-CM | POA: Diagnosis not present

## 2015-02-05 DIAGNOSIS — Z8659 Personal history of other mental and behavioral disorders: Secondary | ICD-10-CM | POA: Insufficient documentation

## 2015-02-05 DIAGNOSIS — Z720 Tobacco use: Secondary | ICD-10-CM | POA: Insufficient documentation

## 2015-02-05 DIAGNOSIS — R05 Cough: Secondary | ICD-10-CM | POA: Diagnosis present

## 2015-02-05 MED ORDER — HYDROCOD POLST-CHLORPHEN POLST 10-8 MG/5ML PO LQCR: 5.0000 mL | Freq: Two times a day (BID) | ORAL | Status: DC | PRN

## 2015-02-05 MED ORDER — ALBUTEROL 90 MCG/ACT IN AERS: 2.0000 | INHALATION_SPRAY | RESPIRATORY_TRACT | Status: DC | PRN

## 2015-02-05 NOTE — ED Provider Notes (Signed)
CSN: [id number]     Arrival date & time 02/05/15  2234 History   First MD Initiated Contact with Patient 02/05/15 2256     No chief complaint on file.    (Consider location/radiation/quality/duration/timing/severity/associated sxs/prior Treatment) HPI Latoya Dean is a 39 y.o. female who comes in for evaluation of cough and congestion for the past 4 weeks. She reports a intermittently dry and productive cough with clear to yellow sputum production. She rates her discomfort as a 7/10. She is a current every day smoker. She has tried TheraFlu without relief. She reports associated postnasal drip She denies fevers but reports intermittent chills. Denies shortness of breath, chest pain, nausea or vomiting, abdominal pain, myalgias. No sick contacts  Past Medical History  Diagnosis Date  . Depression   . HSV-2 (herpes simplex virus 2) infection   . Depression 08/04/2011  . Depression 08/04/2011  . Anxiety   . Bipolar disorder    Past Surgical History  Procedure Laterality Date  . Tonsillectomy    . Cesarean section    . Dilation and curettage of uterus  Jan 2014   Family History  Problem Relation Age of Onset  . Heart disease Mother   . Bipolar disorder Mother   . Heart disease Maternal Grandmother   . Hypertension Maternal Grandmother   . Cancer Maternal Grandfather     multiple myeloma  . Hypertension Paternal Grandmother   . Diabetes Paternal Grandmother   . Bipolar disorder Maternal Aunt    History  Substance Use Topics  . Smoking status: Current Every Day Smoker -- 1.00 packs/day for 19 years    Types: Cigarettes  . Smokeless tobacco: Never Used  . Alcohol Use: 0.6 oz/week    1 Cans of beer per week   OB History    Gravida Para Term Preterm AB TAB SAB Ectopic Multiple Living   '4 2 2  2 2    2     '$ Review of Systems  Constitutional: Negative for fever.  Respiratory: Positive for cough. Negative for shortness of breath.   Cardiovascular: Negative for chest pain.   Skin: Negative for rash.      Allergies  Review of patient's allergies indicates no known allergies.  Home Medications   Prior to Admission medications   Medication Sig Start Date End Date Taking? Authorizing Provider  albuterol (PROVENTIL,VENTOLIN) 90 MCG/ACT inhaler Inhale 2 puffs into the lungs every 4 (four) hours as needed. 02/05/15 01/31/16  Verl Dicker, PA-C  chlorpheniramine-HYDROcodone (TUSSIONEX PENNKINETIC ER) 10-8 MG/5ML LQCR Take 5 mLs by mouth every 12 (twelve) hours as needed for cough (cough). 02/05/15   Verl Dicker, PA-C  HYDROcodone-acetaminophen (NORCO/VICODIN) 5-325 MG per tablet Take 2 tablets by mouth every 4 (four) hours as needed. 11/30/13   Leota Jacobsen, MD   BP 114/73 mmHg  Pulse 73  Temp(Src) 98.3 F (36.8 C) (Oral)  Resp 16  Ht $R'5\' 7"'mB$  (1.702 m)  Wt 170 lb (77.111 kg)  BMI 26.62 kg/m2  SpO2 100% Physical Exam  Constitutional: She appears well-developed and well-nourished. No distress.  Awake, alert, nontoxic appearance.  HENT:  Head: Atraumatic.  Mouth/Throat: Oropharynx is clear and moist. No oropharyngeal exudate.  Eyes: Conjunctivae and EOM are normal. Right eye exhibits no discharge. Left eye exhibits no discharge.  Neck: Normal range of motion. Neck supple.  Cardiovascular: Normal rate, regular rhythm and normal heart sounds.   Pulmonary/Chest: Effort normal and breath sounds normal. No respiratory distress. She has no wheezes.  She has no rales. She exhibits no tenderness.  Musculoskeletal: She exhibits no tenderness.  Baseline ROM, no obvious new focal weakness.  Neurological:  Mental status and motor strength appears baseline for patient and situation.  Skin: No rash noted. She is not diaphoretic.  Psychiatric: She has a normal mood and affect.  Nursing note and vitals reviewed.   ED Course  Procedures (including critical care time) Labs Review Labs Reviewed - No data to display  Imaging Review No results found.    EKG Interpretation None     Filed Vitals:   02/05/15 2246  BP: 114/73  Pulse: 73  Temp: 98.3 F (36.8 C)  TempSrc: Oral  Resp: 16  Height: $Remove'5\' 7"'IbnAXpK$  (1.702 m)  Weight: 170 lb (77.111 kg)  SpO2: 100%    MDM  Vitals stable - WNL -afebrile, no hypoxia Pt resting comfortably in ED. PE--benign lung exam  DDX-patient likely experiencing acute bronchitis. No evidence of acute bacterial process. Will treat conservatively with antitussives, bronchodilators  I discussed all relevant lab findings and imaging results with pt and they verbalized understanding. Discussed f/u with PCP within 48 hrs and return precautions, pt very amenable to plan.  Final diagnoses:  URI (upper respiratory infection)        Verl Dicker, PA-C 02/05/15 9628  Johnna Acosta, MD 02/06/15 [phone]

## 2015-02-05 NOTE — ED Notes (Signed)
Pt reporting chest congestion x 1 week and not feeling well x 1 month.  Sts she has been having chills.  Pt has OTC medication with no relief.

## 2015-02-05 NOTE — Discharge Instructions (Signed)
Cough, Adult  A cough is a reflex that helps clear your throat and airways. It can help heal the body or may be a reaction to an irritated airway. A cough may only last 2 or 3 weeks (acute) or may last more than 8 weeks (chronic).  CAUSES Acute cough:  Viral or bacterial infections. Chronic cough:  Infections.  Allergies.  Asthma.  Post-nasal drip.  Smoking.  Heartburn or acid reflux.  Some medicines.  Chronic lung problems (COPD).  Cancer. SYMPTOMS   Cough.  Fever.  Chest pain.  Increased breathing rate.  High-pitched whistling sound when breathing (wheezing).  Colored mucus that you cough up (sputum). TREATMENT   A bacterial cough may be treated with antibiotic medicine.  A viral cough must run its course and will not respond to antibiotics.  Your caregiver may recommend other treatments if you have a chronic cough. HOME CARE INSTRUCTIONS   Only take over-the-counter or prescription medicines for pain, discomfort, or fever as directed by your caregiver. Use cough suppressants only as directed by your caregiver.  Use a cold steam vaporizer or humidifier in your bedroom or home to help loosen secretions.  Sleep in a semi-upright position if your cough is worse at night.  Rest as needed.  Stop smoking if you smoke. SEEK IMMEDIATE MEDICAL CARE IF:   You have pus in your sputum.  Your cough starts to worsen.  You cannot control your cough with suppressants and are losing sleep.  You begin coughing up blood.  You have difficulty breathing.  You develop pain which is getting worse or is uncontrolled with medicine.  You have a fever. MAKE SURE YOU:   Understand these instructions.  Will watch your condition.  Will get help right away if you are not doing well or get worse. Document Released: 05/27/2011 Document Revised: 02/20/2012 Document Reviewed: 05/27/2011 ExitCare Patient Information 2015 [employer]. This information is not intended  to replace advice given to you by your health care provider. Make sure you discuss any questions you have with your health care provider.  

## 2015-02-19 ENCOUNTER — Encounter (HOSPITAL_COMMUNITY): Payer: Self-pay | Admitting: Psychiatry

## 2015-02-19 ENCOUNTER — Other Ambulatory Visit (HOSPITAL_COMMUNITY): Payer: 59 | Attending: Psychiatry | Admitting: Psychiatry

## 2015-02-19 DIAGNOSIS — G4700 Insomnia, unspecified: Secondary | ICD-10-CM | POA: Insufficient documentation

## 2015-02-19 DIAGNOSIS — F419 Anxiety disorder, unspecified: Secondary | ICD-10-CM | POA: Diagnosis not present

## 2015-02-19 DIAGNOSIS — F331 Major depressive disorder, recurrent, moderate: Secondary | ICD-10-CM | POA: Insufficient documentation

## 2015-02-19 MED ORDER — BUPROPION HCL ER (XL) 150 MG PO TB24: 150.0000 mg | ORAL_TABLET | ORAL | Status: DC
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
## 2015-02-19 NOTE — Progress Notes (Signed)
    Daily Group Progress Note  Program: IOP  Group Time: 9:00-10:30  Participation Level: Active  Behavioral Response: Appropriate  Type of Therapy:  Group Therapy  Summary of Progress: Pt. Met with psychiatrist and case manager.      Group Time: 10:30-12:00  Participation Level:  Active  Behavioral Response: Appropriate  Type of Therapy: Psycho-education Group  Summary of Progress: Pt. Participated in discussion about recognizing stress and use of interventions that help with managing stress such as yoga, meditation, aromatherapy, and breathwork.   Nancie Neas, LPC
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
## 2015-02-23 ENCOUNTER — Other Ambulatory Visit (HOSPITAL_COMMUNITY): Payer: 59 | Admitting: Psychiatry

## 2015-02-23 DIAGNOSIS — F331 Major depressive disorder, recurrent, moderate: Secondary | ICD-10-CM | POA: Diagnosis not present

## 2015-02-23 NOTE — Progress Notes (Signed)
    Daily Group Progress Note  Program: IOP  Group Time: 9:00-10:30  Participation Level: Active  Behavioral Response: Appropriate  Type of Therapy:  Psycho-education Group  Summary of Progress: Pt. Participated in medication education group with Cr[place]Elena.     Group Time: 10:30-12:00  Participation Level:  Active  Behavioral Response: Appropriate  Type of Therapy: Group Therapy  Summary of Progress: Pt. Was alert, attentive, provided thoughtful feedback to other group members about managing work stress by not internalizing workplace problems and communicating assertively. Reported that she had some ups and downs over the weekend. Pt. Reported that she managed her depression better than usual by communicating with her boyfriend.   Shaune PollackBrown, Terelle Dobler B, LPC

## 2015-02-24 ENCOUNTER — Other Ambulatory Visit (HOSPITAL_COMMUNITY): Payer: 59 | Admitting: Psychiatry

## 2015-02-24 DIAGNOSIS — F331 Major depressive disorder, recurrent, moderate: Secondary | ICD-10-CM | POA: Diagnosis not present

## 2015-02-25 ENCOUNTER — Other Ambulatory Visit (HOSPITAL_COMMUNITY): Payer: 59 | Admitting: Psychiatry

## 2015-02-25 DIAGNOSIS — F331 Major depressive disorder, recurrent, moderate: Secondary | ICD-10-CM

## 2015-02-25 NOTE — Progress Notes (Signed)
    Daily Group Progress Note  Program: IOP  Group Time: 9:00-10:30  Participation Level: Active  Behavioral Response: Appropriate  Type of Therapy:  Group Therapy  Summary of Progress: Pt. Smiled and talked appropriately. Pt. Continues to provide appropriate feedback and receives feedback from others. Pt. Reported that she was "feeling pretty good today". Pt. Discussed her initial reluctance to return to group, but received feedback from others that she needed to seek help.      Group Time: 10:30-12:00  Participation Level:  Active  Behavioral Response: Appropriate  Type of Therapy: Psycho-education Group  Summary of Progress: Pt. Participated in progressive muscle relaxation activity. Pt. Reported that she felt more relaxed after the activity.   Shaune PollackBrown, Jennifer B, LPC
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
## 2015-02-26 ENCOUNTER — Other Ambulatory Visit (HOSPITAL_COMMUNITY): Payer: 59 | Admitting: Psychiatry

## 2015-02-26 DIAGNOSIS — F331 Major depressive disorder, recurrent, moderate: Secondary | ICD-10-CM | POA: Diagnosis not present

## 2015-02-26 NOTE — Progress Notes (Signed)
    Daily Group Progress Note  Program: IOP  Group Time: 0900-1030  Participation Level: Active  Behavioral Response: Appropriate, Sharing and Motivated  Type of Therapy:  Group Therapy  Summary of Progress: Pt was very vocal in group; provided a lot of feedback to her peers.  Pt shared that she was sad today.  Voiced that although her parents weren't actively in her life, she feels a sense of accomplishment with purchasing the home she is currently in; without the assistance from them.  Pt states she is breaking the cycle by being present and loving parent in her kid's life.  Pt was extremely tearful in the group.     Group Time: 1045-1200  Participation Level:  Active  Behavioral Response: Appropriate, Sharing and Motivated  Type of Therapy: Psycho-education Group  Summary of Progress: Discharge Planning:  Discussed setting treatment goals for MH-IOP, assessing current resources, exploring new resources, making a plan for keeping yourself safe while in MH-IOP and after discharge.

## 2015-02-27 ENCOUNTER — Other Ambulatory Visit (HOSPITAL_COMMUNITY): Payer: 59 | Admitting: Psychiatry

## 2015-02-27 DIAGNOSIS — F331 Major depressive disorder, recurrent, moderate: Secondary | ICD-10-CM

## 2015-02-27 NOTE — Progress Notes (Signed)
    Daily Group Progress Note  Program: IOP  Group Time: 9:00-10:30  Participation Level: Active  Behavioral Response: Appropriate  Type of Therapy:  Group Therapy  Summary of Progress: Pt. Presented as slightly tearful, smiled appropriately.  Pt. Reported that "I don't know how I feel" and was able to attribute sense of loss and ongoing poor relationship with mother as a significant stressor.     Group Time: 10:30-12:00  Participation Level:  Active  Behavioral Response: Appropriate  Type of Therapy: Psycho-education Group  Summary of Progress: Pt. Was excused from group due to need to attend son's field trip.   Shaune PollackBrown, Jennifer B, LPC

## 2015-03-02 ENCOUNTER — Other Ambulatory Visit (HOSPITAL_COMMUNITY): Payer: 59 | Admitting: Psychiatry

## 2015-03-02 DIAGNOSIS — F331 Major depressive disorder, recurrent, moderate: Secondary | ICD-10-CM

## 2015-03-02 NOTE — Progress Notes (Signed)
    Daily Group Progress Note  Program: IOP  Group Time: 9:00-10:30  Participation Level: Active  Behavioral Response: Appropriate  Type of Therapy:  Psycho-education Group  Summary of Progress: Pt. Participated in medication management group with Michelle NasutiElena.      Group Time: 10:30-12:00  Participation Level:  Active  Behavioral Response: Appropriate  Type of Therapy: Group Therapy  Summary of Progress: Pt. Watched [employer]rene Melanee Cordial video about developing vulnerability as resistance to shame, and numbing behaviors commonly used to cope with pain of vulnerability.   Shaune PollackBrown, Derren Suydam B, LPC

## 2015-03-03 ENCOUNTER — Other Ambulatory Visit (HOSPITAL_COMMUNITY): Payer: 59 | Admitting: Psychiatry

## 2015-03-03 DIAGNOSIS — F331 Major depressive disorder, recurrent, moderate: Secondary | ICD-10-CM | POA: Diagnosis not present

## 2015-03-03 NOTE — Progress Notes (Signed)
    Daily Group Progress Note  Program: IOP  Group Time: 9:00-10:30  Participation Level: Active  Behavioral Response: Appropriate  Type of Therapy:  Group Therapy  Summary of Progress: Pt. Presented with bright affect, talkative. Pt. Reported that she received disappointing news from human resources regarding short-term disability benefits available to her. Pt. Participated in guided meditation and breathing exercise.      Group Time: 10:30-12:00  Participation Level:  Active  Behavioral Response: Appropriate  Type of Therapy: Psycho-education Group  Summary of Progress: Pt. Participated in group facilitated by Alyse with the mental health association.  Shaune PollackBrown, Quashawn Jewkes B, LPC

## 2015-03-04 ENCOUNTER — Other Ambulatory Visit (HOSPITAL_COMMUNITY): Payer: 59 | Admitting: Psychiatry

## 2015-03-04 DIAGNOSIS — F331 Major depressive disorder, recurrent, moderate: Secondary | ICD-10-CM | POA: Diagnosis not present

## 2015-03-04 NOTE — Progress Notes (Signed)
    Daily Group Progress Note  Program: IOP  Group Time: 9:00-10:30  Participation Level: Active  Behavioral Response: Appropriate  Type of Therapy:  Group Therapy  Summary of Progress: Pt. Presented with brightened affect, talkative. Pt. Reported that she was feeling much better and had motivation to do some cleaning in her home yesterday. Pt. Arrived in business/work dress and reported to the group that she is getting herself in the mindset to return to work by dressing appropriately.      Group Time: 10:30-12:00  Participation Level:  Active  Behavioral Response: Appropriate  Type of Therapy: Psycho-education Group  Summary of Progress: Pt. Watched Clovia CuffKristin Neff self-compassion video, participated in discussion of developing self-kindness, recognizing common humanity, and role of mindfulness in emotion regulation.  Benjaman PottAYLOR,GERALD D, MD

## 2015-03-04 NOTE — Progress Notes (Signed)
Patient ID: Latoya Dean, female   DOB: [birth year], 39 y.o.   MRN: [id number] ACTIVITIES:  Resume all activities as tolerated. DIET:  Resume a heart healthy diet. TEST:  As recommended by you out patient provider. Discharge Note  Patient:  Latoya BrittleLeilani D Dean is an 39 y.o., female DOB:  [birth year]  Date of Admission:  02/19/2015  Date of Discharge: 03/09/2015  Reason for Admission:depression  IOP Course:  [name] has used the program very well.  She has a very positive attitude, was helpful and even inspiring to her peers in the group.  She lost her job while in the program and handled it very well.  She says it was helpful to be around supportive people and to have a chance to talk about her issues to others.  She is still depressed but more optimistic and hopeful about her future and even looking forward to the upcoming changes.  Mental Status at Discharge:still depressed but optimistic and hopeful  Lab Results: No results found for this or any previous visit (from the past 48 hour(s)).   Current outpatient prescriptions:  .  albuterol (PROVENTIL,VENTOLIN) 90 MCG/ACT inhaler, Inhale 2 puffs into the lungs every 4 (four) hours as needed., Disp: 17 g, Rfl: 3 .  buPROPion (WELLBUTRIN XL) 150 MG 24 hr tablet, Take 1 tablet (150 mg total) by mouth every morning., Disp: 30 tablet, Rfl: 2 .  chlorpheniramine-HYDROcodone (TUSSIONEX PENNKINETIC ER) 10-8 MG/5ML LQCR, Take 5 mLs by mouth every 12 (twelve) hours as needed for cough (cough)., Disp: 115 mL, Rfl: 0 .  HYDROcodone-acetaminophen (NORCO/VICODIN) 5-325 MG per tablet, Take 2 tablets by mouth every 4 (four) hours as needed., Disp: 10 tablet, Rfl: 0 .  [DISCONTINUED] medroxyPROGESTERone (DEPO-PROVERA) 150 MG/ML injection, Inject 150 mg into the muscle every 3 (three) months. Next injection due in March/2015, Disp: , Rfl:   Axis Diagnosis:  Major depression, recurrent, mderate   Level of Care:  IOP  Discharge destination:  Other:   return to outpatient therapy and med management  Is patient on multiple antipsychotic therapies at discharge:  No    Has Patient had three or more failed trials of antipsychotic monotherapy by history:  Negative  Patient phone:  [phone] (home)  Patient address:   [address] [place] [address],   Follow-up recommendations:  Activity:  continue current activity level Diet:  continue usual diet  Comments:  [name] has great skills and maturity she has gained over the years which will hold her in good stead for the future.  The patient received suicide prevention pamphlet:  Yes Belongings returned:  na  TAYLOR,GERALD D 03/04/2015, 10:37 AM

## 2015-03-05 ENCOUNTER — Other Ambulatory Visit (HOSPITAL_COMMUNITY): Payer: 59

## 2015-03-06 ENCOUNTER — Other Ambulatory Visit (HOSPITAL_COMMUNITY): Payer: 59

## 2015-03-09 ENCOUNTER — Other Ambulatory Visit (HOSPITAL_COMMUNITY): Payer: 59 | Admitting: Psychiatry

## 2015-03-09 DIAGNOSIS — F331 Major depressive disorder, recurrent, moderate: Secondary | ICD-10-CM

## 2015-03-09 NOTE — Progress Notes (Signed)
    Daily Group Progress Note  Program: IOP  Group Time: 9:00-10:30  Participation Level: Active  Behavioral Response: Appropriate  Type of Therapy:  Group Therapy  Summary of Progress: Pt. Presented with bright affect and talkative. Pt. Reported that she had a good Easter weekend with her husband's family. Pt. Reports that generally holidays are difficult for her and a trigger for her depression. Pt. Discussed awareness that her mother triggers her feelings of anxiety and depression.      Group Time: 10:30-12:00  Participation Level:  Active  Behavioral Response: Appropriate  Type of Therapy: Psycho-education Group  Summary of Progress: Pt. Watched and discussed Jerelyn Charlesim Harford video about transforming frustrations into opportunities to exercise creativity and resourcefulness.   Shaune PollackBrown, Brinleigh Tew B, LPC

## 2015-03-10 ENCOUNTER — Other Ambulatory Visit (HOSPITAL_COMMUNITY): Payer: 59 | Admitting: Psychiatry

## 2015-03-10 DIAGNOSIS — F331 Major depressive disorder, recurrent, moderate: Secondary | ICD-10-CM

## 2015-03-10 NOTE — Progress Notes (Signed)
    Daily Group Progress Note  Program: IOP  Group Time: 9:00-10:30  Participation Level: Active  Behavioral Response: Appropriate  Type of Therapy:  Group Therapy  Summary of Progress: Pt. Reported that she was feeling "pretty good". Pt. Shared with the group that a friend sent her a box of picture frames for her home and she was happy that she felt like arranging them. Pt. Also reported energy to take care of her children.      Group Time: 10:30-12:00  Participation Level:  Active  Behavioral Response: Appropriate  Type of Therapy: Psycho-education Group  Summary of Progress: Pt. Watched Shawn Achor video and participated in discussion about activities that encourage a positive mental state such as gratitude, journaling, exercise, meditation and engaging in random acts of kindness.  Shaune PollackBrown, Xariah Silvernail B, LPC

## 2015-03-11 ENCOUNTER — Other Ambulatory Visit (HOSPITAL_COMMUNITY): Payer: 59 | Admitting: Psychiatry

## 2015-03-11 DIAGNOSIS — F331 Major depressive disorder, recurrent, moderate: Secondary | ICD-10-CM

## 2015-03-11 NOTE — Patient Instructions (Signed)
Patient completed MH-IOP today.  Will follow up with Dr. Arfeen on 3Lolly Mustache-31-16 @ 1 pm.  Provided pt with [facility] phone number for individual counseling.  Encouraged support groups.

## 2015-03-11 NOTE — Progress Notes (Signed)
[name] is a 39 y.o. , single, employed, african [place] female, who was a self referral. 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 Pt stated she returned due to worsening depressive and anxiety symptoms. Other symptoms included: Increased appetite (gained 15 lbs within a couple of months), poor concentration, tearfulness, anhedonia, decreased motivation, low energy and sadness. Denied any SI/HI or A/V hallucinations. Multiple stressors: 1) Job ([employer]) of almost one year. Pt is an admissions representative. Performance has gone down. Has been written up due to attendance and tardiness issues. Received final warning. Reports conflict with her manager. "He is inappropriate with my coworkers and he shows favoritism. Pt filed a complaint with EEOC (c/o discrimination). Pt stated since filing that she is now getting a lot of walk ins. 2) Pt recently moved into a new home. [place] 3) Hx of non-compliancy with treatment. Hx of seeing therapists and psychiatrists. Denied any previous psychiatric hospitalizations or suicide attempts/gestures. Family Hx: Mother and Maternal Aunt (Depression). Pt completed MH-IOP today.  Reports improved appetite.  Continues to struggle with sadness and sleep.  "I am sad at times and sometimes it's difficult for me to stay asleep."  Denies SI/HI or A/V hallucinations.  While in MH-IOP; pt has been fired from her job.  Pt reports she is waiting to hear back from EEOC re: investigation. Pt has been exploring job options.   A:  D/C today.  F/U with Dr. Lolly MustacheArfeen on 03-12-15 @ 1 pm and provided pt with [facility] phone number for individual counseling.  Encouraged support groups.  Encouraged pt to contact vocational rehab.  R:  Pt receptive.

## 2015-03-11 NOTE — Progress Notes (Signed)
    Daily Group Progress Note  Program: IOP  Group Time: 9:00-10:30  Participation Level: Active  Behavioral Response: Appropriate  Type of Therapy:  Group Therapy  Summary of Progress: Pt. Presented with bright affect, smiled and talked appropriately. Pt. Reported readiness for discharge and looking for a new job. Pt. Reports that she continues to work on developing motivation for taking care of her home and improving her self-care.     Group Time: 10:30-12:00  Participation Level:  Active  Behavioral Response: Appropriate  Type of Therapy: Psycho-education Group  Summary of Progress: Pt. Participated "just for today-the choice is mine" exercise. Pt. Prepared for discharge.   Shaune PollackBrown, Jennifer B, LPC

## 2015-03-12 ENCOUNTER — Other Ambulatory Visit (HOSPITAL_COMMUNITY): Payer: 59 | Admitting: Psychiatry

## 2015-03-12 ENCOUNTER — Ambulatory Visit (HOSPITAL_COMMUNITY): Payer: Self-pay | Admitting: Psychiatry

## 2015-03-13 ENCOUNTER — Other Ambulatory Visit (HOSPITAL_COMMUNITY): Payer: 59 | Admitting: Psychiatry

## 2015-03-16 ENCOUNTER — Other Ambulatory Visit (HOSPITAL_COMMUNITY): Payer: 59

## 2015-03-17 ENCOUNTER — Other Ambulatory Visit (HOSPITAL_COMMUNITY): Payer: 59

## 2015-03-18 ENCOUNTER — Other Ambulatory Visit (HOSPITAL_COMMUNITY): Payer: 59

## 2015-03-18 ENCOUNTER — Encounter (HOSPITAL_COMMUNITY): Payer: Self-pay

## 2015-03-18 ENCOUNTER — Emergency Department (INDEPENDENT_AMBULATORY_CARE_PROVIDER_SITE_OTHER)
Admission: EM | Admit: 2015-03-18 | Discharge: 2015-03-18 | Disposition: A | Discharge status: Home / Self Care | Payer: 59 | Source: Home / Self Care | Attending: Emergency Medicine | Admitting: Emergency Medicine

## 2015-03-18 DIAGNOSIS — B369 Superficial mycosis, unspecified: Secondary | ICD-10-CM | POA: Diagnosis not present

## 2015-03-18 NOTE — ED Notes (Signed)
States she has had problem for a couple of months w skin on palmar aspect of fingers both hands (mostly thumbs) and on the back of her heel. Minimal relief w OTC teatment

## 2015-03-18 NOTE — ED Provider Notes (Signed)
CSN: [id number]     Arrival date & time 03/18/15  1816 History   First MD Initiated Contact with Patient 03/18/15 1832     Chief Complaint  Patient presents with  . Skin Problem   (Consider location/radiation/quality/duration/timing/severity/associated sxs/prior Treatment) HPI Comments: 39 year old female with a 3 month history of a dry area of skin and a small fissure to the flexor crease of the left hand. She has used various lotions and is not getting better. 2 days ago she started using an anti-uncle crane. The patient has a "double jointed "thumb and is able to hyperextend by 90. By doing that she has excessive skin and the thumb is extension or flexion. This provides skin over skin contact with the potential space to allow fungi to grow.   Past Medical History  Diagnosis Date  . Depression   . HSV-2 (herpes simplex virus 2) infection   . Depression 08/04/2011  . Depression 08/04/2011  . Anxiety   . Bipolar disorder    Past Surgical History  Procedure Laterality Date  . Tonsillectomy    . Cesarean section    . Dilation and curettage of uterus  Jan 2014   Family History  Problem Relation Age of Onset  . Heart disease Mother   . Bipolar disorder Mother   . Heart disease Maternal Grandmother   . Hypertension Maternal Grandmother   . Cancer Maternal Grandfather     multiple myeloma  . Hypertension Paternal Grandmother   . Diabetes Paternal Grandmother   . Bipolar disorder Maternal Aunt    History  Substance Use Topics  . Smoking status: Current Every Day Smoker -- 1.00 packs/day for 19 years    Types: Cigarettes  . Smokeless tobacco: Never Used  . Alcohol Use: 0.6 oz/week    1 Cans of beer per week   OB History    Gravida Para Term Preterm AB TAB SAB Ectopic Multiple Living   _0 Review of Systems  Skin:       As per history of present illness  All other systems reviewed and are negative.   Allergies  Review of patient's allergies indicates no  known allergies.  Home Medications   Prior to Admission medications   Medication Sig Start Date End Date Taking? Authorizing Provider  buPROPion (WELLBUTRIN XL) 150 MG 24 hr tablet Take 1 tablet (150 mg total) by mouth every morning. 02/19/15 02/19/16  Clarene Reamer, MD   BP 117/70 mmHg  Pulse 62  Temp(Src) 98.2 F (36.8 C) (Oral)  Resp 18  SpO2 98% Physical Exam  Constitutional: She is oriented to person, place, and time. She appears well-developed and well-nourished. No distress.  Pulmonary/Chest: Effort normal. No respiratory distress.  Neurological: She is alert and oriented to person, place, and time.  Skin: Skin is warm and dry.  C physical exam in history of present illness. There is no bleeding, drainage or moisture. The areas dry and scaly. The area discussed is a small fissure of the flexor surface just proximal to the flexor crease.  Psychiatric: She has a normal mood and affect.  Nursing note and vitals reviewed.   ED Course  Procedures (including critical care time) Labs Review Labs Reviewed - No data to display  Imaging Review No results found.   MDM   1. Fungal skin infection    Lamisil or Lotrimin AF see your doctor asneeded    Janne Napoleon, NP  03/18/15 1852

## 2015-03-19 ENCOUNTER — Other Ambulatory Visit (HOSPITAL_COMMUNITY): Payer: 59

## 2015-03-20 ENCOUNTER — Other Ambulatory Visit (HOSPITAL_COMMUNITY): Payer: 59

## 2015-03-23 ENCOUNTER — Other Ambulatory Visit (HOSPITAL_COMMUNITY): Payer: 59

## 2015-03-24 ENCOUNTER — Other Ambulatory Visit (HOSPITAL_COMMUNITY): Payer: 59

## 2015-03-25 ENCOUNTER — Other Ambulatory Visit (HOSPITAL_COMMUNITY): Payer: 59

## 2015-03-26 ENCOUNTER — Other Ambulatory Visit (HOSPITAL_COMMUNITY): Payer: 59

## 2015-03-27 ENCOUNTER — Other Ambulatory Visit (HOSPITAL_COMMUNITY): Payer: 59

## 2015-03-30 ENCOUNTER — Other Ambulatory Visit (HOSPITAL_COMMUNITY): Payer: 59

## 2015-03-31 ENCOUNTER — Other Ambulatory Visit (HOSPITAL_COMMUNITY): Payer: 59

## 2015-04-01 ENCOUNTER — Other Ambulatory Visit (HOSPITAL_COMMUNITY): Payer: 59

## 2015-04-02 ENCOUNTER — Other Ambulatory Visit (HOSPITAL_COMMUNITY): Payer: 59

## 2016-03-08 ENCOUNTER — Encounter (HOSPITAL_COMMUNITY): Payer: Self-pay | Admitting: Family Medicine

## 2016-03-08 ENCOUNTER — Emergency Department (HOSPITAL_COMMUNITY)
Admission: EM | Admit: 2016-03-08 | Discharge: 2016-03-08 | Disposition: A | Discharge status: Home / Self Care | Payer: 59 | Attending: Emergency Medicine | Admitting: Emergency Medicine

## 2016-03-08 DIAGNOSIS — R6883 Chills (without fever): Secondary | ICD-10-CM | POA: Insufficient documentation

## 2016-03-08 DIAGNOSIS — F17210 Nicotine dependence, cigarettes, uncomplicated: Secondary | ICD-10-CM | POA: Insufficient documentation

## 2016-03-08 DIAGNOSIS — F419 Anxiety disorder, unspecified: Secondary | ICD-10-CM | POA: Insufficient documentation

## 2016-03-08 DIAGNOSIS — K002 Abnormalities of size and form of teeth: Secondary | ICD-10-CM | POA: Insufficient documentation

## 2016-03-08 DIAGNOSIS — K047 Periapical abscess without sinus: Secondary | ICD-10-CM | POA: Insufficient documentation

## 2016-03-08 DIAGNOSIS — Z8619 Personal history of other infectious and parasitic diseases: Secondary | ICD-10-CM | POA: Insufficient documentation

## 2016-03-08 DIAGNOSIS — K029 Dental caries, unspecified: Secondary | ICD-10-CM | POA: Insufficient documentation

## 2016-03-08 DIAGNOSIS — F319 Bipolar disorder, unspecified: Secondary | ICD-10-CM | POA: Insufficient documentation

## 2016-03-08 DIAGNOSIS — K0889 Other specified disorders of teeth and supporting structures: Secondary | ICD-10-CM | POA: Insufficient documentation

## 2016-03-08 MED ORDER — HYDROCODONE-ACETAMINOPHEN 5-325 MG PO TABS: 2.0000 | ORAL_TABLET | ORAL | Status: DC | PRN

## 2016-03-08 MED ORDER — IBUPROFEN 400 MG PO TABS
800.0000 mg | ORAL_TABLET | Freq: Once | ORAL | Status: AC
  Administered 2016-03-08: 800 mg via ORAL
  Filled 2016-03-08: qty 2

## 2016-03-08 MED ORDER — PENICILLIN V POTASSIUM 500 MG PO TABS: 500.0000 mg | ORAL_TABLET | Freq: Four times a day (QID) | ORAL | Status: AC

## 2016-03-08 NOTE — ED Notes (Signed)
Pt here for abscess to upper left gum and dental pain.

## 2016-03-08 NOTE — ED Provider Notes (Signed)
History  By signing my name below, I, Marlowe Kays, attest that this documentation has been prepared under the direction and in the presence of Alyse Low, [place]. Electronically Signed: Marlowe Kays, ED Scribe. 03/08/2016. 9:32 AM.  Chief Complaint  Patient presents with  . Abscess  . Dental Pain   The history is provided by the patient and medical records. No language interpreter was used.    HPI Comments:  Latoya Dean is a 40 y.o. female who presents to the Emergency Department complaining of left upper dental pain that began yesterday. She reports she thinks she may have a dental abscess. She reports associated chills. She states she fractured the tooth some time ago but never got it fixed because she does not have a dentist. Pt has not taken anything for pain. Touching the area, talking and eating increases the pain. She denies alleviating factors. She denies fever, nausea or vomiting.   Past Medical History  Diagnosis Date  . Depression   . HSV-2 (herpes simplex virus 2) infection   . Depression 08/04/2011  . Depression 08/04/2011  . Anxiety   . Bipolar disorder [facility])    Past Surgical History  Procedure Laterality Date  . Tonsillectomy    . Cesarean section    . Dilation and curettage of uterus  Jan 2014   Family History  Problem Relation Age of Onset  . Heart disease Mother   . Bipolar disorder Mother   . Heart disease Maternal Grandmother   . Hypertension Maternal Grandmother   . Cancer Maternal Grandfather     multiple myeloma  . Hypertension Paternal Grandmother   . Diabetes Paternal Grandmother   . Bipolar disorder Maternal Aunt    Social History  Substance Use Topics  . Smoking status: Current Every Day Smoker -- 1.00 packs/day for 19 years    Types: Cigarettes  . Smokeless tobacco: Never Used  . Alcohol Use: 0.6 oz/week    1 Cans of beer per week   OB History    Gravida Para Term Preterm AB TAB SAB Ectopic Multiple Living   '4 2 2  2 2    2      '$ Review of Systems  Constitutional: Positive for chills.  HENT: Positive for dental problem.   All other systems reviewed and are negative.   Allergies  Review of patient's allergies indicates no known allergies.  Home Medications   Prior to Admission medications   Medication Sig Start Date End Date Taking? Authorizing Provider  buPROPion (WELLBUTRIN XL) 150 MG 24 hr tablet Take 1 tablet (150 mg total) by mouth every morning. 02/19/15 02/19/16  Clarene Reamer, MD   Triage Vitals: BP 111/66 mmHg  Pulse 84  Temp(Src) 99.2 F (37.3 C) (Oral)  Resp 18  Ht '5\' 7"'$  (1.702 m)  Wt 192 lb (87.091 kg)  BMI 30.06 kg/m2  SpO2 100% Physical Exam  Constitutional: She is oriented to person, place, and time. She appears well-developed and well-nourished.  HENT:  Head: Normocephalic and atraumatic.  Mouth/Throat: No trismus in the jaw. Abnormal dentition. Dental abscesses and dental caries present.  Swollen area of left upper gingiva. Broken tooth with tooth fragments still present.  Eyes: EOM are normal.  Neck: Normal range of motion.  Cardiovascular: Normal rate.   Pulmonary/Chest: Effort normal.  Musculoskeletal: Normal range of motion.  Neurological: She is alert and oriented to person, place, and time.  Skin: Skin is warm and dry.  Psychiatric: She has a normal mood and  affect. Her behavior is normal.  Nursing note and vitals reviewed.   ED Course  Procedures (including critical care time) DIAGNOSTIC STUDIES: Oxygen Saturation is 100% on RA, normal by my interpretation.   COORDINATION OF CARE: 9:30 AM- Will prescribe PCN and give dental resources for pt to follow up. Pt verbalizes understanding and agrees to plan.  Medications - No data to display   MDM   Final diagnoses:  Tooth ache    Meds ordered this encounter  Medications  . penicillin v potassium (VEETID) 500 MG tablet    Sig: Take 1 tablet (500 mg total) by mouth 4 (four) times daily.    Dispense:  40  tablet    Refill:  0    Order Specific Question:  Supervising Provider    Answer:  MILLER, BRIAN [[id number]]  . HYDROcodone-acetaminophen (NORCO/VICODIN) 5-325 MG tablet    Sig: Take 2 tablets by mouth every 4 (four) hours as needed.    Dispense:  16 tablet    Refill:  0    Order Specific Question:  Supervising Provider    Answer:  MILLER, BRIAN [[id number]]  . ibuprofen (ADVIL,MOTRIN) tablet 800 mg    Sig:   An After Visit Summary was printed and given to the patient.    Hollace Kinnier [place], PA-C 03/08/16 [place], MD 03/08/16 2256

## 2016-03-08 NOTE — Discharge Instructions (Signed)

## 2016-03-08 NOTE — ED Notes (Signed)
Pt requesting Ibuprofen. Ordered.

## 2016-03-08 NOTE — ED Notes (Signed)
Abscess left upper, inner gum. Onset yesterday.

## 2020-04-20 ENCOUNTER — Telehealth: Payer: Self-pay | Admitting: *Deleted

## 2020-04-20 NOTE — Telephone Encounter (Signed)
Pt left VM message stating that she has a positive home UPT. She will be going to Urgent care today for confirmation. Her husband has sarcoidosis and has been on large doses of steroids. He is concerned for any possible harm to the baby  from the steroids. Please call back to discuss.

## 2020-04-21 NOTE — Telephone Encounter (Signed)
Called patient stating I was returning her phone call. Discussed that paternal use of medication wouldn't affect a growing baby. Patient verbalized understanding & states she just wasn't sure because her husband's doctor advised they not become pregnant. Discussed that could likely be due to stress/his health at the time not how it would affect the baby. Patient verbalized understanding & had no questions.

## 2020-04-26 ENCOUNTER — Encounter (HOSPITAL_COMMUNITY): Payer: Self-pay | Admitting: *Deleted

## 2020-04-26 ENCOUNTER — Ambulatory Visit (HOSPITAL_COMMUNITY)
Admission: EM | Admit: 2020-04-26 | Discharge: 2020-04-26 | Disposition: A | Discharge status: Home / Self Care | Payer: PRIVATE HEALTH INSURANCE | Attending: Family Medicine | Admitting: Family Medicine

## 2020-04-26 ENCOUNTER — Other Ambulatory Visit: Payer: Self-pay

## 2020-04-26 DIAGNOSIS — Z3201 Encounter for pregnancy test, result positive: Secondary | ICD-10-CM | POA: Diagnosis not present

## 2020-04-26 DIAGNOSIS — N912 Amenorrhea, unspecified: Secondary | ICD-10-CM | POA: Diagnosis not present

## 2020-04-26 LAB — POC URINE PREG, ED: Preg Test, Ur: POSITIVE — AB

## 2020-04-26 NOTE — ED Provider Notes (Signed)
[place]   [id number] 04/26/20 Arrival Time: 60   CC: AMENORRHEA  SUBJECTIVE:  Latoya Dean is a 44 y.o. female who presents with complaints of amenorrhea x 2 mos. Reports that she had a positive pregnancy test at home earlier in the week. She states that she called her OB and that they would not see her without having a positive pregnancy test in the system. She denies fever, chills, nausea, vomiting, abdominal or pelvic pain, urinary symptoms, vaginal itching, vaginal odor, vaginal bleeding, dyspareunia, vaginal rashes or lesions.   Patient's last menstrual period was 02/22/2020 (approximate). Current birth control method: none  ROS: As per HPI.  All other pertinent ROS negative.     Past Medical History:  Diagnosis Date  . Anxiety   . Bipolar disorder ([place])   . Depression   . Depression 08/04/2011  . Depression 08/04/2011  . HSV-2 (herpes simplex virus 2) infection    Past Surgical History:  Procedure Laterality Date  . CESAREAN SECTION    . [place] OF UTERUS  Jan 2014  . TONSILLECTOMY     Allergies  Allergen Reactions  . Amoxicillin Rash   No current facility-administered medications on file prior to encounter.   Current Outpatient Medications on File Prior to Encounter  Medication Sig Dispense Refill  . buPROPion (WELLBUTRIN XL) 150 MG 24 hr tablet Take 1 tablet (150 mg total) by mouth every morning. 30 tablet 2  . HYDROcodone-acetaminophen (NORCO/VICODIN) 5-325 MG tablet Take 2 tablets by mouth every 4 (four) hours as needed. 16 tablet 0  . [DISCONTINUED] medroxyPROGESTERone (DEPO-PROVERA) 150 MG/ML injection Inject 150 mg into the muscle every 3 (three) months. Next injection due in March/2015      Social History   Socioeconomic History  . Marital status: Single    Spouse name: Not on file  . Number of children: Not on file  . Years of education: Not on file  . Highest education level: Not on file  Occupational History  .  Not on file  Tobacco Use  . Smoking status: Current Every Day Smoker    Packs/day: 1.00    Years: 19.00    Pack years: 19.00    Types: Cigarettes  . Smokeless tobacco: Never Used  Substance and Sexual Activity  . Alcohol use: Yes    Comment: occasionally  . Drug use: No  . Sexual activity: Yes    Birth control/protection: None  Other Topics Concern  . Not on file  Social History Narrative  . Not on file   Social Determinants of Health   Financial Resource Strain:   . Difficulty of Paying Living Expenses:   Food Insecurity:   . Worried About Charity fundraiser in the Last Year:   . Arboriculturist in the Last Year:   Transportation Needs:   . Film/video editor (Medical):   Marland Kitchen Lack of Transportation (Non-Medical):   Physical Activity:   . Days of Exercise per Week:   . Minutes of Exercise per Session:   Stress:   . Feeling of Stress :   Social Connections:   . Frequency of Communication with Friends and Family:   . Frequency of Social Gatherings with Friends and Family:   . Attends Religious Services:   . Active Member of Clubs or Organizations:   . Attends Archivist Meetings:   Marland Kitchen Marital Status:   Intimate Partner Violence:   . Fear of Current or Ex-Partner:   .  Emotionally Abused:   Marland Kitchen Physically Abused:   . Sexually Abused:    Family History  Problem Relation Age of Onset  . Heart disease Mother   . Bipolar disorder Mother   . Bipolar disorder Maternal Aunt   . Heart disease Maternal Grandmother   . Hypertension Maternal Grandmother   . Cancer Maternal Grandfather        multiple myeloma  . Hypertension Paternal Grandmother   . Diabetes Paternal Grandmother     OBJECTIVE:  Vitals:   04/26/20 1756  BP: 120/74  Pulse: 83  Resp: 16  Temp: 98.3 F (36.8 C)  TempSrc: Oral  SpO2: 100%     General appearance: Alert, NAD, appears stated age Head: NCAT Throat: lips, mucosa, and tongue normal; teeth and gums normal Lungs: CTA  bilaterally without adventitious breath sounds Heart: regular rate and rhythm.  Radial pulses 2+ symmetrical bilaterally Back: no CVA tenderness Abdomen: soft, non-tender; bowel sounds normal; no masses or organomegaly; no guarding or rebound tenderness GU: declines  Skin: warm and dry Psychological:  Alert and cooperative. Normal mood and affect.  LABS:  Results for orders placed or performed during the hospital encounter of 04/26/20  POC urine preg, ED (not at [facility])  Result Value Ref Range   Preg Test, Ur POSITIVE (A) NEGATIVE    Labs Reviewed  POC URINE PREG, ED - Abnormal; Notable for the following components:      Result Value   Preg Test, Ur POSITIVE (*)    All other components within normal limits    ASSESSMENT & PLAN:  1. Amenorrhea   2. Positive pregnancy test     No orders of the defined types were placed in this encounter.   Pending: Labs Reviewed  POC URINE PREG, ED - Abnormal; Notable for the following components:      Result Value   Preg Test, Ur POSITIVE (*)    All other components within normal limits    Follow up with [facility] for prenatal care. Return here or go to ER if you have any new or worsening symptoms fever, chills, nausea, vomiting, abdominal or pelvic pain, painful intercourse, vaginal discharge, vaginal bleeding, persistent symptoms despite treatment.  Reviewed expectations re: course of current medical issues. Questions answered. Outlined signs and symptoms indicating need for more acute intervention. Patient verbalized understanding. After Visit Summary given.        Faustino Congress, NP 04/27/20 1930

## 2020-04-26 NOTE — ED Triage Notes (Addendum)
Pt reports positive home pregnancy test; states unable to get appt with OB unless she comes to [facility] first to confirm. Upon completing triage, pt states she's been having some cramping and had some spotting following intercourse on 2 occasions.

## 2020-04-26 NOTE — Discharge Instructions (Addendum)
You are pregnant.  Please follow up with OB at the [facility]

## 2020-05-04 ENCOUNTER — Encounter: Payer: Self-pay | Admitting: *Deleted

## 2020-05-18 ENCOUNTER — Other Ambulatory Visit: Payer: Self-pay

## 2020-05-18 ENCOUNTER — Ambulatory Visit (INDEPENDENT_AMBULATORY_CARE_PROVIDER_SITE_OTHER): Payer: PRIVATE HEALTH INSURANCE | Admitting: *Deleted

## 2020-05-18 VITALS — BP 104/74 | HR 79 | Temp 98.2°F

## 2020-05-18 DIAGNOSIS — Z3201 Encounter for pregnancy test, result positive: Secondary | ICD-10-CM | POA: Diagnosis not present

## 2020-05-18 DIAGNOSIS — O0990 Supervision of high risk pregnancy, unspecified, unspecified trimester: Secondary | ICD-10-CM

## 2020-05-18 DIAGNOSIS — O200 Threatened abortion: Secondary | ICD-10-CM

## 2020-05-18 LAB — POCT PREGNANCY, URINE: Preg Test, Ur: POSITIVE — AB

## 2020-05-18 NOTE — Progress Notes (Signed)
Pt reports LMP 02/22/20 however has had spotting and some intermittent light pink bleeding since LMP. Pain and cramping on Lt side since end of April. Cramping in back also. UPT is positive. Per plan of care by Dr. Mayford Knife, (documented in note by Raynald Blend, RN today) pt is to have BHCG.today. If result is >5, pt is to have repeat non-stat BHCG in 2 days. Pt agrees to plan of care. She was advised to go to [facility]U if she develops heavy vaginal bleeding or severe abdominal cramping/pain.

## 2020-05-18 NOTE — Progress Notes (Signed)
I connected with  Latoya Dean on 05/18/20 at  9:15 AM EDT by telephone and verified that I am speaking with the correct person using two identifiers.   I discussed the limitations, risks, security and privacy concerns of performing an evaluation and management service by telephone and the availability of in person appointments. I also discussed with the patient that there may be a patient responsible charge related to this service. The patient expressed understanding and agreed to proceed.   She had pregnancy confirmed with Urgent care.  She reports LMP around 02/22/20 but then had a period last week that lasted 05/02/20- 05/10/20 . States it was light , not like usual period.  States usual period 3-4 days; also states has had spotting since she found out she is pregnant. Discussed with Dr.Williams, and recommends get pregnancy test to see if she is still pregnant or if she has had a miscarriage. If + get non stat bhcg. If bhcg above 5 get repeat in 2 days.  Informed [name] we will cancel new ob visit for tomorrow. Dr. Nolon Stalls she come in for a pregnancy test asap and blood work if needed. Also instructed her to go to [facility]U if she has severe pain or heavy bleeding. She voices understanding. Sael Furches,RN 05/18/2020  9:32 AM

## 2020-05-19 ENCOUNTER — Encounter: Payer: 59 | Admitting: Medical

## 2020-05-19 LAB — BETA HCG QUANT (REF LAB): hCG Quant: 5720 m[IU]/mL

## 2020-05-20 ENCOUNTER — Telehealth: Payer: Self-pay | Admitting: *Deleted

## 2020-05-20 ENCOUNTER — Telehealth: Payer: Self-pay | Admitting: Obstetrics and Gynecology

## 2020-05-20 DIAGNOSIS — O3680X0 Pregnancy with inconclusive fetal viability, not applicable or unspecified: Secondary | ICD-10-CM

## 2020-05-20 NOTE — Telephone Encounter (Addendum)
Pt left VM stating she is calling to discuss test results.  1705  Called pt following consult with Steward Drone. Pt was informed of BHCG results and that the level of hormone is not consistent with her LMP. This may mean that her dates are off or she may possible have a failed pregnancy. I advised pt that the recommendation is for her to have [place] for dating and to confirm that this is a viable pregnancy. The [place] will likely be next week sometime and she will come to office for results on the same Fiana Gladu. She will be notified of [place] appt details via MyChart. Pt states that she has not had any additional bleeding since 5/30. She denies pain. Pt was reminded that she should go to MAU if she develops heavy vaginal bleeding or abdominal/pelvic pain. PT voiced understanding of all information and instructions given.

## 2020-05-20 NOTE — Telephone Encounter (Signed)
Requesting a call back for results. 

## 2020-05-25 ENCOUNTER — Other Ambulatory Visit: Payer: Self-pay | Admitting: Obstetrics & Gynecology

## 2020-05-25 DIAGNOSIS — Z3490 Encounter for supervision of normal pregnancy, unspecified, unspecified trimester: Secondary | ICD-10-CM

## 2020-06-01 ENCOUNTER — Other Ambulatory Visit: Payer: Self-pay

## 2020-06-01 ENCOUNTER — Other Ambulatory Visit: Payer: Self-pay | Admitting: Certified Nurse Midwife

## 2020-06-01 ENCOUNTER — Ambulatory Visit
Admission: RE | Admit: 2020-06-01 | Discharge: 2020-06-01 | Disposition: A | Discharge status: Home / Self Care | Payer: PRIVATE HEALTH INSURANCE | Source: Ambulatory Visit | Attending: Certified Nurse Midwife | Admitting: Certified Nurse Midwife

## 2020-06-01 ENCOUNTER — Inpatient Hospital Stay (HOSPITAL_COMMUNITY)
Admission: AD | Admit: 2020-06-01 | Discharge: 2020-06-01 | Disposition: A | Discharge status: Home / Self Care | Payer: PRIVATE HEALTH INSURANCE | Attending: Obstetrics and Gynecology | Admitting: Obstetrics and Gynecology

## 2020-06-01 ENCOUNTER — Ambulatory Visit (INDEPENDENT_AMBULATORY_CARE_PROVIDER_SITE_OTHER): Payer: PRIVATE HEALTH INSURANCE

## 2020-06-01 DIAGNOSIS — Z3A00 Weeks of gestation of pregnancy not specified: Secondary | ICD-10-CM | POA: Diagnosis not present

## 2020-06-01 DIAGNOSIS — O99330 Smoking (tobacco) complicating pregnancy, unspecified trimester: Secondary | ICD-10-CM | POA: Diagnosis not present

## 2020-06-01 DIAGNOSIS — O99340 Other mental disorders complicating pregnancy, unspecified trimester: Secondary | ICD-10-CM | POA: Diagnosis not present

## 2020-06-01 DIAGNOSIS — Z79899 Other long term (current) drug therapy: Secondary | ICD-10-CM | POA: Diagnosis not present

## 2020-06-01 DIAGNOSIS — Z833 Family history of diabetes mellitus: Secondary | ICD-10-CM | POA: Insufficient documentation

## 2020-06-01 DIAGNOSIS — Z8249 Family history of ischemic heart disease and other diseases of the circulatory system: Secondary | ICD-10-CM | POA: Insufficient documentation

## 2020-06-01 DIAGNOSIS — O3680X0 Pregnancy with inconclusive fetal viability, not applicable or unspecified: Secondary | ICD-10-CM | POA: Insufficient documentation

## 2020-06-01 DIAGNOSIS — F17210 Nicotine dependence, cigarettes, uncomplicated: Secondary | ICD-10-CM | POA: Insufficient documentation

## 2020-06-01 DIAGNOSIS — F319 Bipolar disorder, unspecified: Secondary | ICD-10-CM | POA: Insufficient documentation

## 2020-06-01 DIAGNOSIS — O0289 Other abnormal products of conception: Secondary | ICD-10-CM | POA: Diagnosis not present

## 2020-06-01 DIAGNOSIS — F419 Anxiety disorder, unspecified: Secondary | ICD-10-CM | POA: Diagnosis not present

## 2020-06-01 DIAGNOSIS — Z818 Family history of other mental and behavioral disorders: Secondary | ICD-10-CM | POA: Insufficient documentation

## 2020-06-01 DIAGNOSIS — O09529 Supervision of elderly multigravida, unspecified trimester: Secondary | ICD-10-CM | POA: Insufficient documentation

## 2020-06-01 DIAGNOSIS — Z880 Allergy status to penicillin: Secondary | ICD-10-CM | POA: Diagnosis not present

## 2020-06-01 DIAGNOSIS — Z712 Person consulting for explanation of examination or test findings: Secondary | ICD-10-CM

## 2020-06-01 LAB — COMPREHENSIVE METABOLIC PANEL
ALT: 12 U/L (ref 0–44)
AST: 14 U/L — ABNORMAL LOW (ref 15–41)
Albumin: 3.5 g/dL (ref 3.5–5.0)
Alkaline Phosphatase: 62 U/L (ref 38–126)
Anion gap: 7 (ref 5–15)
BUN: 8 mg/dL (ref 6–20)
CO2: 26 mmol/L (ref 22–32)
Calcium: 9.2 mg/dL (ref 8.9–10.3)
Chloride: 105 mmol/L (ref 98–111)
Creatinine, Ser: 0.7 mg/dL (ref 0.44–1.00)
GFR calc Af Amer: >60 mL/min (ref >60)
GFR calc non Af Amer: >60 mL/min (ref >60)
Glucose, Bld: 88 mg/dL (ref 70–99)
Potassium: 3.7 mmol/L (ref 3.5–5.1)
Sodium: 138 mmol/L (ref 135–145)
Total Bilirubin: 0.6 mg/dL (ref 0.3–1.2)
Total Protein: 7.3 g/dL (ref 6.5–8.1)

## 2020-06-01 LAB — CBC
HCT: 32.5 % — ABNORMAL LOW (ref 36.0–46.0)
Hemoglobin: 9.9 g/dL — ABNORMAL LOW (ref 12.0–15.0)
MCH: 23.9 pg — ABNORMAL LOW (ref 26.0–34.0)
MCHC: 30.5 g/dL (ref 30.0–36.0)
MCV: 78.3 fL — ABNORMAL LOW (ref 80.0–100.0)
Platelets: 307 10*3/uL (ref 150–400)
RBC: 4.15 MIL/uL (ref 3.87–5.11)
RDW: 17.9 % — ABNORMAL HIGH (ref 11.5–15.5)
WBC: 5.7 10*3/uL (ref 4.0–10.5)
nRBC: 0 % (ref 0.0–0.2)

## 2020-06-01 LAB — HCG, QUANTITATIVE, PREGNANCY: hCG, Beta Chain, Quant, S: 1084 m[IU]/mL — ABNORMAL HIGH (ref <5)

## 2020-06-01 NOTE — Progress Notes (Signed)
Pt on nurse visit schedule for [place] results. Pt stated to front office she is unable to stay for results and requested a phone call.   Results called by radiologist to Earlene Plater, MD, who states this is a possible ectopic pregnancy and pt should be seen in MAU. Called pt with results. Explained that she needs to go immediately to the MAU for follow up due to possible ectopic pregnancy. Explained the US showed no IUP, but follow up is needed to determine if this is an ectopic pregnancy. Pt verbalized understanding and intent to go directly to MAU.  Fleet Contras RN 06/01/20

## 2020-06-01 NOTE — MAU Provider Note (Signed)
History     CSN: [id number]  Arrival date and time: 06/01/20 1528   First Provider Initiated Contact with Patient 06/01/20 1600      Chief Complaint  Patient presents with  . Ectopic Evaluation   HPI Latoya Dean is a 44 y.o. [id number] with pregnancy of unknown location who presents to MAU for repeat Quant hCG. She is s/p evaluation  Patient endorses an episodes of "excruciating" abdominal pain early Sunday morning which resolved without intervention. She denies vaginal bleeding and states her spotting resolved 2-3 weeks ago.    OB History    Gravida  5   Para  2   Term  2   Preterm      AB  2   Living  2     SAB      TAB  2   Ectopic      Multiple      Live Births  2           Past Medical History:  Diagnosis Date  . Anxiety   . Bipolar disorder ([place])   . Depression   . Depression 08/04/2011  . Depression 08/04/2011  . HSV-2 (herpes simplex virus 2) infection     Past Surgical History:  Procedure Laterality Date  . CESAREAN SECTION    . [place] OF UTERUS  Jan 2014  . TONSILLECTOMY      Family History  Problem Relation Age of Onset  . Heart disease Mother   . Bipolar disorder Mother   . Bipolar disorder Maternal Aunt   . Heart disease Maternal Grandmother   . Hypertension Maternal Grandmother   . Cancer Maternal Grandfather        multiple myeloma  . Hypertension Paternal Grandmother   . Diabetes Paternal Grandmother     Social History   Tobacco Use  . Smoking status: Current Every Day Smoker    Packs/day: 1.00    Years: 19.00    Pack years: 19.00    Types: Cigarettes  . Smokeless tobacco: Never Used  Vaping Use  . Vaping Use: Never used  Substance Use Topics  . Alcohol use: Yes    Comment: occasionally  . Drug use: No    Allergies:  Allergies  Allergen Reactions  . Amoxicillin Rash    Medications Prior to Admission  Medication Sig Dispense Refill Last Dose  . buPROPion (WELLBUTRIN XL) 150 MG 24 hr  tablet Take 1 tablet (150 mg total) by mouth every morning. 30 tablet 2   . HYDROcodone-acetaminophen (NORCO/VICODIN) 5-325 MG tablet Take 2 tablets by mouth every 4 (four) hours as needed. 16 tablet 0     Review of Systems  Gastrointestinal: Negative for abdominal pain.  Genitourinary: Negative for vaginal bleeding.  All other systems reviewed and are negative.  Physical Exam   Blood pressure 112/78, pulse 62, temperature 98.6 F (37 C), temperature source Oral, resp. rate 20, height '5\' 7"'$  (1.702 m), weight 102.2 kg, last menstrual period 02/22/2020, SpO2 100 %.  Physical Exam  Nursing note and vitals reviewed. Cardiovascular: Normal rate and normal pulses.  Respiratory: Effort normal and breath sounds normal.  GI: Normal appearance. There is no abdominal tenderness.  Neurological: She is alert.    MAU Course/MDM  Procedures  --Decreasing Quant hCG in asymptomatic patient -- Quant hCG 5,720 on 05/18/2020, 1,084 this evening --Discussed with Dr. Roselie Awkward, who advised repeat non-stat Quant hCG in one week  Patient Vitals for the past 24 hrs:  BP Temp Temp src Pulse Resp SpO2 Height Weight  06/01/20 1559 112/78 98.6 F (37 C) Oral 62 20 100 % -- --  06/01/20 1555 -- -- -- -- -- -- '5\' 7"'$  (1.702 m) 102.2 kg   Results for orders placed or performed during the hospital encounter of 06/01/20 (from the past 24 hour(s))  CBC     Status: Abnormal   Collection Time: 06/01/20  5:20 PM  Result Value Ref Range   WBC 5.7 4.0 - 10.5 K/uL   RBC 4.15 3.87 - 5.11 MIL/uL   Hemoglobin 9.9 (L) 12.0 - 15.0 g/dL   HCT 32.5 (L) 36 - 46 %   MCV 78.3 (L) 80.0 - 100.0 fL   MCH 23.9 (L) 26.0 - 34.0 pg   MCHC 30.5 30.0 - 36.0 g/dL   RDW 17.9 (H) 11.5 - 15.5 %   Platelets 307 150 - 400 K/uL   nRBC 0.0 0.0 - 0.2 %  Comprehensive metabolic panel     Status: Abnormal   Collection Time: 06/01/20  5:20 PM  Result Value Ref Range   Sodium 138 135 - 145 mmol/L   Potassium 3.7 3.5 - 5.1 mmol/L    Chloride 105 98 - 111 mmol/L   CO2 26 22 - 32 mmol/L   Glucose, Bld 88 70 - 99 mg/dL   BUN 8 6 - 20 mg/dL   Creatinine, Ser 0.70 0.44 - 1.00 mg/dL   Calcium 9.2 8.9 - 10.3 mg/dL   Total Protein 7.3 6.5 - 8.1 g/dL   Albumin 3.5 3.5 - 5.0 g/dL   AST 14 (L) 15 - 41 U/L   ALT 12 0 - 44 U/L   Alkaline Phosphatase 62 38 - 126 U/L   Total Bilirubin 0.6 0.3 - 1.2 mg/dL   GFR calc non Af Amer >60 >60 mL/min   GFR calc Af Amer >60 >60 mL/min   Anion gap 7 5 - 15  hCG, quantitative, pregnancy     Status: Abnormal   Collection Time: 06/01/20  5:20 PM  Result Value Ref Range   hCG, Beta Chain, Quant, S 1,084 (H) <5 mIU/mL   Assessment and Plan  --44 y.o. [id number]  --Decreasing quant hCG --Stable and asymptomatic during MAU evaluation --Discharge home in stable condition  F/U: --Repeat Quant hCG in one week [facility], CNM 06/01/2020, 7:42 PM

## 2020-06-01 NOTE — Progress Notes (Signed)
I have reviewed this chart and agree with the RN/CMA assessment and management.   Received call from Radiology who report suspicious right adnexal mass with no IUP although no definitive ectopic pregnancy. Reviewed with RN that patient should present to MAU for HCG and further eval.    Baldemar Lenis, M.D. [facility])

## 2020-06-01 NOTE — MAU Note (Addendum)
Pt sent from MD office for blood work and possible MTX secondary  possible ectopic pregnancy versus miscarriage.

## 2020-06-01 NOTE — Discharge Instructions (Signed)
Ectopic Pregnancy ° °An ectopic pregnancy happens when a fertilized egg grows outside the womb (uterus). The fertilized egg cannot stay alive outside of the womb. This problem often happens in a fallopian tube. It is often caused by damage to the tube. °If this problem is found early, you may be treated with medicine that stops the egg from growing. If your tube tears or bursts open (ruptures), you will bleed inside. Often, there is very bad pain in the lower belly. This is an emergency. You will need surgery. Get help right away. °Follow these instructions at home: °After being treated with medicine or surgery: °· Rest and limit your activity for as long as told by your doctor. °· Until your doctor says that it is safe: °? Do not lift anything that is heavier than 10 lb (4.5 kg) or the limit that your doctor tells you. °? Avoid exercise and any movement that takes a lot of effort. °· To prevent problems when pooping (constipation): °? Eat a healthy diet. This includes: °§ Fruits. °§ Vegetables. °§ Whole grains. °? Drink 6-8 glasses of water a day. °Contact a doctor if: °Get help right away if: °· You have sudden and very bad pain in your belly. °· You have very bad pain in your shoulders or neck. °· You have pain that gets worse and is not helped by medicine. °· You have: °? A fever or chills. °? Vaginal bleeding. °? Redness or swelling at the site of a surgical cut (incision). °· You feel sick to your stomach (nauseous) or you throw up (vomit). °· You feel dizzy or weak. °· You feel light-headed or you pass out (faint). °Summary °· An ectopic pregnancy happens when a fertilized egg grows outside the womb (uterus). °· If this problem is found early, you may be treated with medicine that stops the egg from growing. °· If your tube tears or bursts open (ruptures), you will need surgery. This is an emergency. Get help right away. °This information is not intended to replace advice given to you by your health care  provider. Make sure you discuss any questions you have with your health care provider. °Document Revised: 11/10/2017 Document Reviewed: 12/22/2016 °Elsevier Patient Education © 2020 [employer] ° °

## 2021-05-17 IMAGING — US US OB < 14 WEEKS - US OB TV
1 series · 14 of 28 positions shown · non-contrast
Comparison: None

CLINICAL DATA: First trimester pregnancy, LMP 02/22/2020,
assessment of dates and viability; quantitative beta HCG 5,720 on
05/18/2020

EXAM:
OBSTETRIC <14 WK US AND TRANSVAGINAL OB US
TECHNIQUE: Both transabdominal and transvaginal ultrasound examinations were
performed for complete evaluation of the gestation as well as the
maternal uterus, adnexal regions, and pelvic cul-de-sac.
Transvaginal technique was performed to assess early pregnancy.

[Series 1: us ob < 14 weeks - us ob tv · 81 acquisitions, 14 frames shown]
[im 3/81]
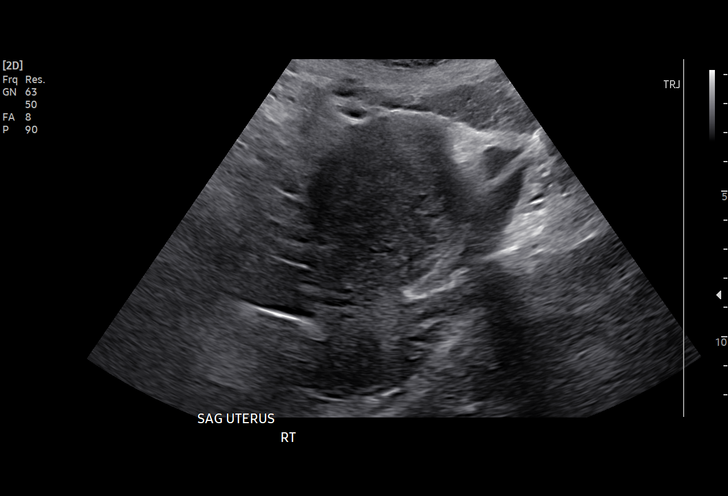
[im 9/81]
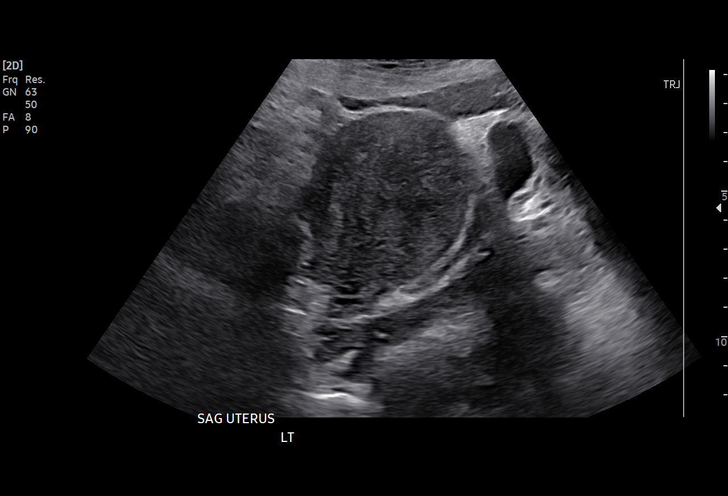
[im 15/81]
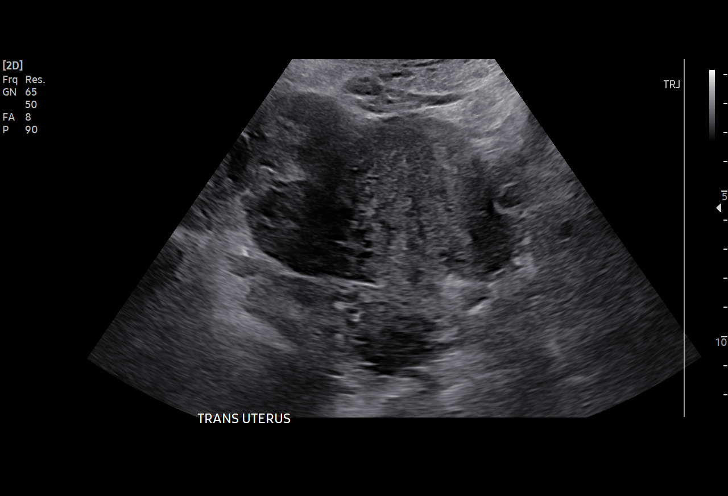
[im 21/81]
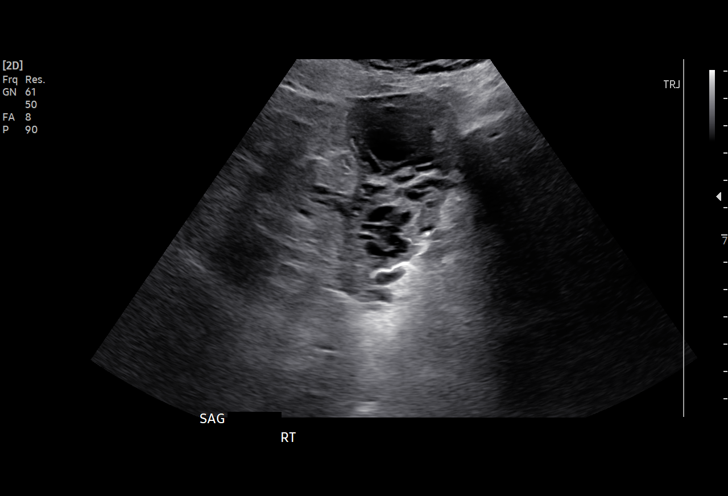
[im 27/81]
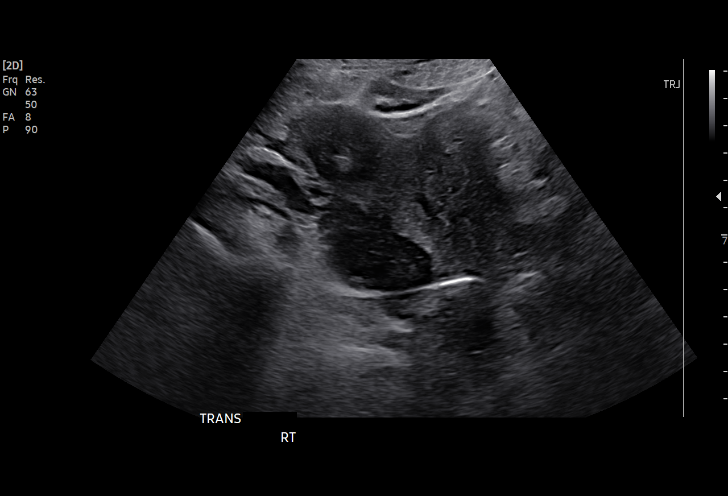
[im 33/81]
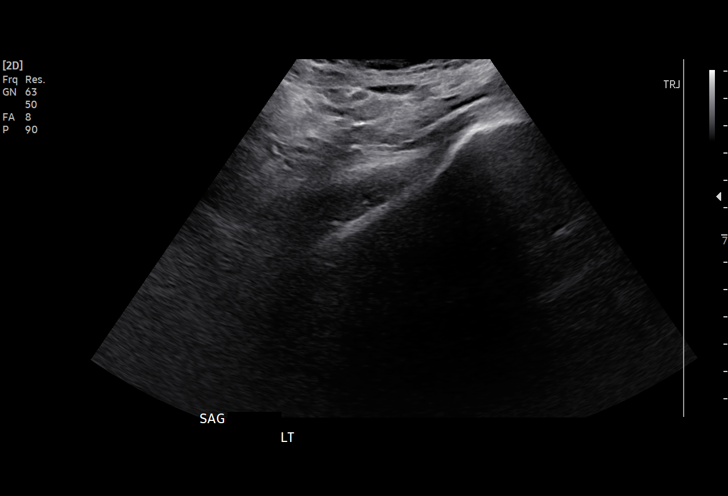
[im 39/81]
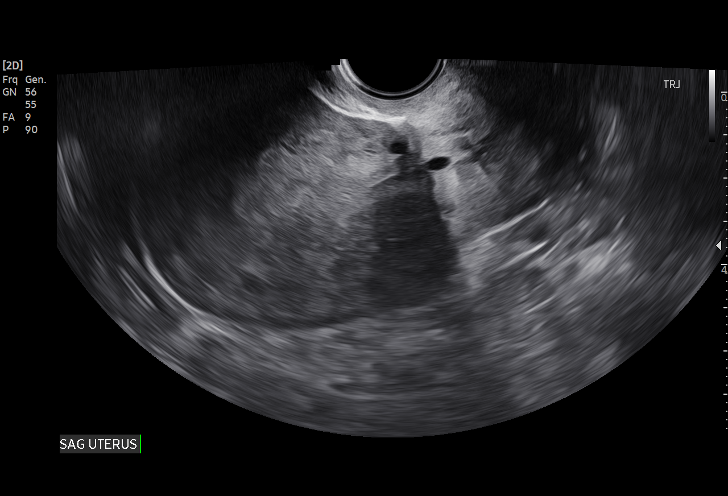
[im 45/81]
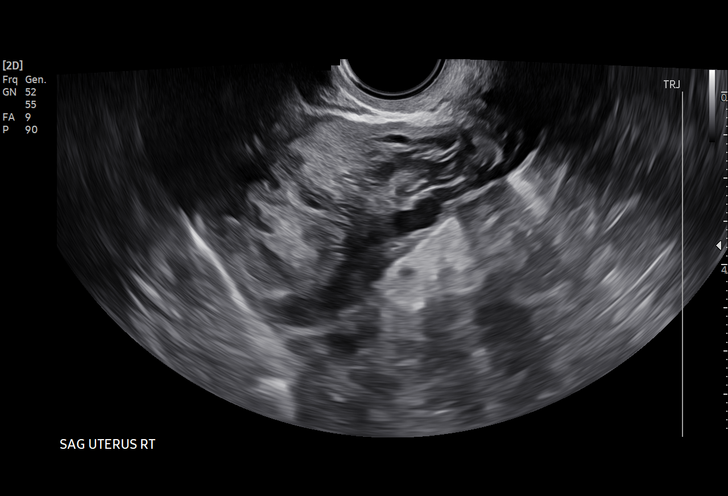
[im 51/81]
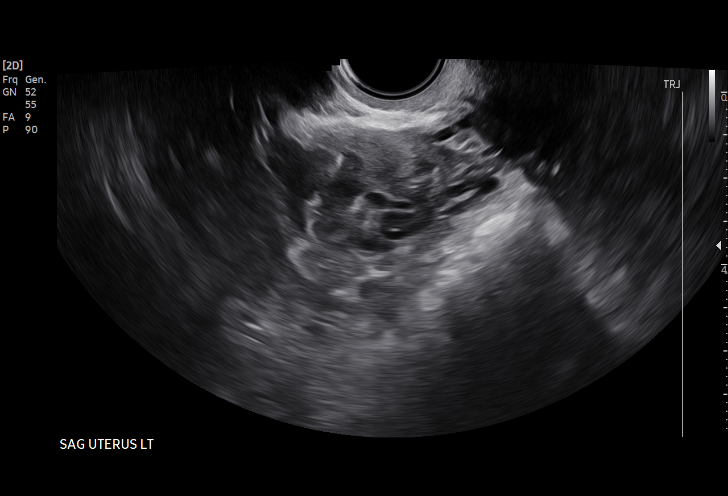
[im 57/81]
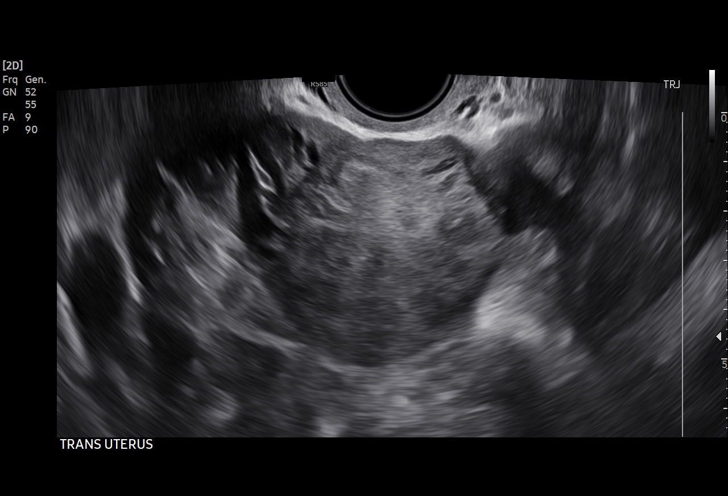
[im 63/81]
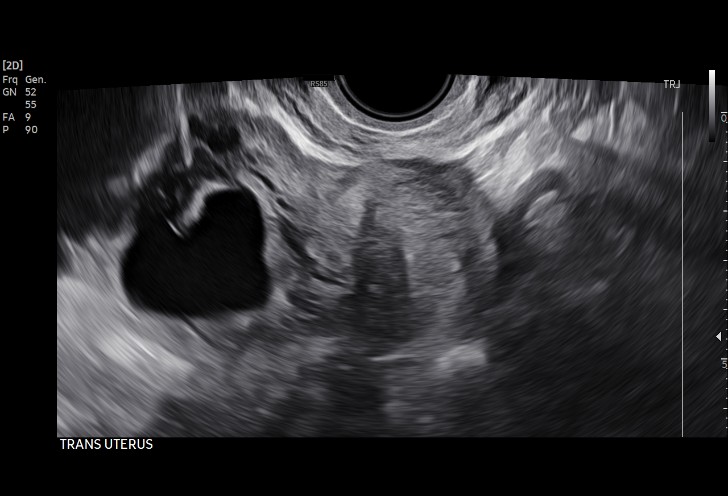
[im 69/81]
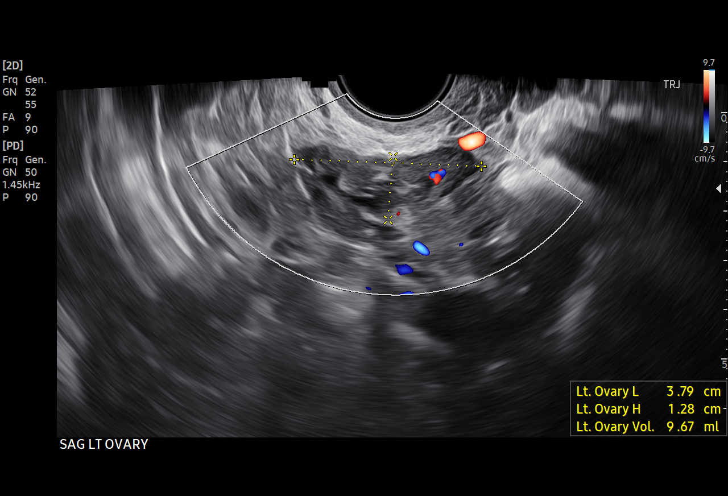
[im 75/81]
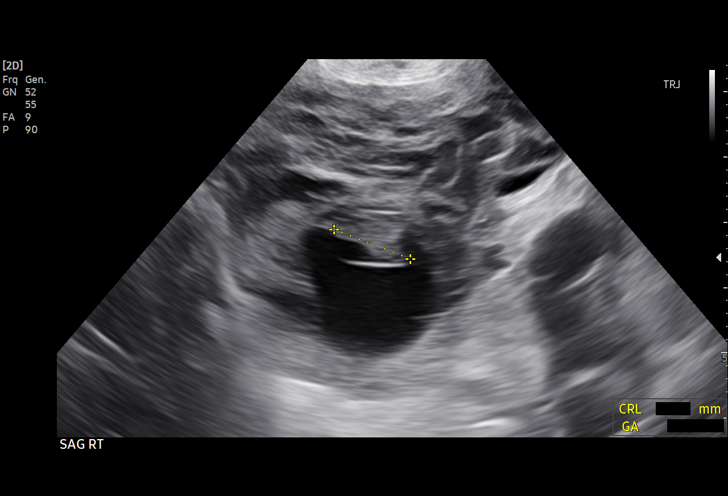
[im 81/81]
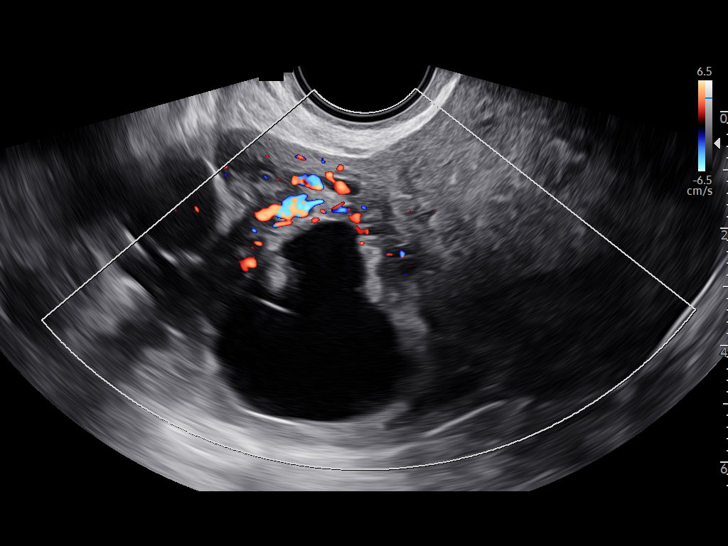

[14 of 28 positions shown; findings below may reference images not displayed]

FINDINGS: Intrauterine gestational sac: None identified

Yolk sac:  N/A

Embryo:  N/A

Cardiac Activity: N/A

Heart Rate: N/A  bpm

MSD:   mm    w     d

CRL:    mm    w    d                  US EDC:

Subchorionic hemorrhage:  N/A

Maternal uterus/adnexae:

No intrauterine gestational sac identified.

LEFT anterior intramural uterine leiomyoma 17 x 9 x 13 mm.

Heterogeneous myometrium without additional masses.

LEFT ovary normal size and morphology, 3.8 x 1.3 x 2.7 cm.

RIGHT ovary normal size and morphology, 2.7 x 1.1 x 2.2 cm.

Focal cystic fluid collection identified in RIGHT adnexa, 38 x 27 x
34 mm, with a mean diameter of 33.9 mm, containing an internal soft
tissue focus versus debris measuring 12.5 mm length, unable to
exclude ectopic pregnancy with this appearance.

No additional free pelvic fluid or other masses.
IMPRESSION: No intrauterine gestation identified.

Small intramural leiomyoma anterior LEFT uterus 17 mm greatest size.

Focal cystic collection in the RIGHT adnexa with a mean diameter of
33.9 mm, question gestational sac containing a at fetal pole versus
debris measuring 12.5 mm length.

This could represent a complicated RIGHT ovarian/adnexal cystic
lesion or a RIGHT adnexal ectopic pregnancy; recommend correlation
with quantitative beta HCG.

Critical Value/emergent results were called by telephone at the time
of interpretation on 06/01/2020 at 7466 hrs to provider Dr. Satchell,
who verbally acknowledged these results.
